# Patient Record
Sex: Female | Born: 1990 | Race: White | Hispanic: No | Smoking: Former smoker
Health system: Southern US, Community
[De-identification: ages and names within clinical notes are randomized; demographics above are authoritative.]

## PROBLEM LIST (undated history)

## (undated) ENCOUNTER — Inpatient Hospital Stay (HOSPITAL_COMMUNITY): Payer: Self-pay

## (undated) ENCOUNTER — Emergency Department (HOSPITAL_COMMUNITY): Admission: EM | Payer: BC Managed Care – PPO | Source: Home / Self Care

## (undated) DIAGNOSIS — O009 Unspecified ectopic pregnancy without intrauterine pregnancy: Secondary | ICD-10-CM

## (undated) DIAGNOSIS — F329 Major depressive disorder, single episode, unspecified: Secondary | ICD-10-CM

## (undated) DIAGNOSIS — F419 Anxiety disorder, unspecified: Secondary | ICD-10-CM

## (undated) DIAGNOSIS — J45909 Unspecified asthma, uncomplicated: Secondary | ICD-10-CM

## (undated) DIAGNOSIS — F32A Depression, unspecified: Secondary | ICD-10-CM

## (undated) DIAGNOSIS — J439 Emphysema, unspecified: Secondary | ICD-10-CM

## (undated) DIAGNOSIS — F319 Bipolar disorder, unspecified: Secondary | ICD-10-CM

## (undated) DIAGNOSIS — I10 Essential (primary) hypertension: Secondary | ICD-10-CM

## (undated) DIAGNOSIS — F99 Mental disorder, not otherwise specified: Secondary | ICD-10-CM

## (undated) HISTORY — PX: DILATION AND CURETTAGE OF UTERUS: SHX78

## (undated) HISTORY — DX: Essential (primary) hypertension: I10

## (undated) HISTORY — PX: LAPAROSCOPY FOR ECTOPIC PREGNANCY: SUR765

## (undated) HISTORY — PX: ADENOIDECTOMY AND MYRINGOTOMY WITH TUBE PLACEMENT: SHX5714

---
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
## 2011-07-19 LAB — CBC
HCT: 25.3 — ABNORMAL LOW
HCT: 27.9 — ABNORMAL LOW
HCT: 30.8 — ABNORMAL LOW
Hemoglobin: 10 — ABNORMAL LOW
Hemoglobin: 10.5 — ABNORMAL LOW
Hemoglobin: 8.7 — ABNORMAL LOW
Hemoglobin: 9.6 — ABNORMAL LOW
MCHC: 34
MCHC: 34.3
MCHC: 34.3
MCV: 87.5
MCV: 87.6
Platelets: 198
Platelets: 210
Platelets: 220
RBC: 2.88 — ABNORMAL LOW
RBC: 3.19 — ABNORMAL LOW
RBC: 3.34 — ABNORMAL LOW
RBC: 3.51 — ABNORMAL LOW
RDW: 14.1
RDW: 14.4
WBC: 10.7

## 2011-07-19 LAB — COMPREHENSIVE METABOLIC PANEL
AST: 18
Alkaline Phosphatase: 175 — ABNORMAL HIGH
Alkaline Phosphatase: 185 — ABNORMAL HIGH
Calcium: 8.8
Calcium: 9.1
Chloride: 107
Potassium: 3.9
Potassium: 3.9
Sodium: 135
Sodium: 136
Total Bilirubin: 0.5
Total Bilirubin: 0.5
Total Protein: 6.1

## 2011-07-19 LAB — GLUCOSE, CAPILLARY: Glucose-Capillary: 79

## 2011-07-19 LAB — URINALYSIS, ROUTINE W REFLEX MICROSCOPIC
Bilirubin Urine: NEGATIVE
Glucose, UA: NEGATIVE
Hgb urine dipstick: NEGATIVE
Ketones, ur: NEGATIVE
Nitrite: NEGATIVE
Protein, ur: NEGATIVE
Specific Gravity, Urine: 1.015
Urobilinogen, UA: 0.2
pH: 8

## 2011-07-19 LAB — POCT URINALYSIS DIP (DEVICE)
Hgb urine dipstick: NEGATIVE
Ketones, ur: NEGATIVE
Urobilinogen, UA: 0.2
pH: 7.5

## 2011-07-19 LAB — URIC ACID: Uric Acid, Serum: 5.7

## 2011-07-19 LAB — STREP B DNA PROBE

## 2013-05-30 ENCOUNTER — Encounter (HOSPITAL_COMMUNITY): Payer: Self-pay | Admitting: *Deleted

## 2013-05-30 ENCOUNTER — Emergency Department (HOSPITAL_COMMUNITY)
Admission: EM | Admit: 2013-05-30 | Discharge: 2013-05-31 | Disposition: A | Discharge status: Home / Self Care | Payer: Medicaid Other | Attending: Emergency Medicine | Admitting: Emergency Medicine

## 2013-05-30 DIAGNOSIS — R059 Cough, unspecified: Secondary | ICD-10-CM | POA: Insufficient documentation

## 2013-05-30 DIAGNOSIS — R0789 Other chest pain: Secondary | ICD-10-CM

## 2013-05-30 DIAGNOSIS — Z760 Encounter for issue of repeat prescription: Secondary | ICD-10-CM | POA: Insufficient documentation

## 2013-05-30 DIAGNOSIS — Z79899 Other long term (current) drug therapy: Secondary | ICD-10-CM | POA: Insufficient documentation

## 2013-05-30 DIAGNOSIS — R079 Chest pain, unspecified: Secondary | ICD-10-CM | POA: Insufficient documentation

## 2013-05-30 DIAGNOSIS — F17200 Nicotine dependence, unspecified, uncomplicated: Secondary | ICD-10-CM | POA: Insufficient documentation

## 2013-05-30 DIAGNOSIS — R05 Cough: Secondary | ICD-10-CM | POA: Insufficient documentation

## 2013-05-30 HISTORY — DX: Unspecified ectopic pregnancy without intrauterine pregnancy: O00.90

## 2013-05-30 NOTE — ED Notes (Signed)
Pt states she was seen at [facility] and was diagnosed with pneumonia. Pt given ibuprofen and toradol and that they are not helping with her pain. Pt states that she gets pneumonia twice a year. Pt went back to [place] and was get more rx medications Ultram and prilosec and she did not get the ultram rx filled because she has tried that before and that this does not work for her pain. Pt states that she wants something to help her sleep and pain control.

## 2013-05-31 MED ORDER — HYDROCODONE-ACETAMINOPHEN 5-325 MG PO TABS
1.0000 | ORAL_TABLET | Freq: Once | ORAL | Status: AC
  Administered 2013-05-31: 1 via ORAL
  Filled 2013-05-31: qty 1

## 2013-05-31 MED ORDER — HYDROCODONE-ACETAMINOPHEN 5-325 MG PO TABS: 1.0000 | ORAL_TABLET | Freq: Four times a day (QID) | ORAL | Status: DC | PRN

## 2013-05-31 NOTE — ED Provider Notes (Signed)
CSN: [id number]     Arrival date & time 05/30/13  2242 History     First MD Initiated Contact with Patient 05/30/13 2349     Chief Complaint  Patient presents with  . Muscle Pain  . Medication Refill   (Consider location/radiation/quality/duration/timing/severity/associated sxs/prior Treatment) HPI Comments: Patient was diagnosed with pneumonia at [facility] on Friday.  She was given azithromycin, and Toradol.  She's been taking her azithromycin she returned today complaining of shortness of breath.  She was rex-rayed showed no pneumothorax or pneumonia is resolving.  She continues to take her antibiotic and use her inhaler, but still having left posterior rib/chest discomfort.  It is keeping her from taking a deep breath, or sleeping.  She, states [facility], but notoriously has taken this in the past and it.  Has not helped.  She asked for something stronger and was refused.  She states she has an appointment with her primary care physician on Wednesday  Patient is a 22 y.o. female presenting with musculoskeletal pain. The history is provided by the patient.  Muscle Pain This is a new problem. The current episode started in the past 7 days. The problem occurs constantly. The problem has been gradually worsening. Associated symptoms include coughing. Pertinent negatives include no fever, headaches, nausea or weakness. The symptoms are aggravated by coughing and exertion. She has tried NSAIDs for the symptoms. The treatment provided no relief.    Past Medical History  Diagnosis Date  . Ectopic pregnancy    History reviewed. No pertinent past surgical history. History reviewed. No pertinent family history. History  Substance Use Topics  . Smoking status: Current Every Day Smoker  . Smokeless tobacco: Not on file  . Alcohol Use: Yes   OB History   Grav Para Term Preterm Abortions TAB SAB Ect Mult Living                 Review of Systems   Constitutional: Negative for fever.  Respiratory: Positive for cough. Negative for wheezing.   Gastrointestinal: Negative for nausea.  Neurological: Negative for dizziness, weakness and headaches.  All other systems reviewed and are negative.    Allergies  Review of patient's allergies indicates no known allergies.  Home Medications   Current Outpatient Rx  Name  Route  Sig  Dispense  Refill  . clonazePAM (KLONOPIN) 1 MG tablet   Oral   Take 1 mg by mouth 4 (four) times daily as needed for anxiety.         . hydrOXYzine (VISTARIL) 25 MG capsule   Oral   Take 25 mg by mouth daily.         . LamoTRIgine (LAMICTAL PO)   Oral   Take 1 tablet by mouth 2 (two) times daily.         . sertraline (ZOLOFT) 50 MG tablet   Oral   Take 50 mg by mouth 2 (two) times daily.         Marland Kitchen HYDROcodone-acetaminophen (NORCO/VICODIN) 5-325 MG per tablet   Oral   Take 1 tablet by mouth every 6 (six) hours as needed for pain.   14 tablet   0    BP 123/66  Pulse 73  Temp(Src) 98.1 F (36.7 C) (Oral)  Resp 20  SpO2 100% Physical Exam  Nursing note and vitals reviewed. Constitutional: She appears well-developed and well-nourished.  HENT:  Head: Normocephalic.  Eyes: Pupils are equal, round, and reactive to light.  Neck: Normal range of motion.  Cardiovascular: Normal rate.   Pulmonary/Chest: Effort normal and breath sounds normal. She exhibits tenderness.  Musculoskeletal: Normal range of motion.  Neurological: She is alert.  Skin: Skin is warm and dry. No rash noted. No erythema.    ED Course   Procedures (including critical care time)  Labs Reviewed - No data to display No results found. 1. Left-sided chest wall pain     MDM   Will provide patient with prescription for Vicodin, and encouraged her to continue her antibiotic inhaler, and Toradol, and to make sure you keep an appointment with her primary care physician as scheduled on Wednesday  Arman Filter,  NP 05/31/13 0015

## 2013-05-31 NOTE — ED Provider Notes (Signed)
Medical screening examination/treatment/procedure(s) were performed by non-physician practitioner and as supervising physician I was immediately available for consultation/collaboration.   Hanley Seamen, MD 05/31/13 [id number]

## 2014-07-08 ENCOUNTER — Inpatient Hospital Stay (HOSPITAL_COMMUNITY)
Admission: AD | Admit: 2014-07-08 | Discharge: 2014-07-08 | Disposition: A | Discharge status: Home / Self Care | Payer: Medicaid Other | Source: Ambulatory Visit | Attending: Obstetrics and Gynecology | Admitting: Obstetrics and Gynecology

## 2014-07-08 ENCOUNTER — Encounter (HOSPITAL_COMMUNITY): Payer: Self-pay

## 2014-07-08 DIAGNOSIS — R109 Unspecified abdominal pain: Secondary | ICD-10-CM | POA: Diagnosis present

## 2014-07-08 DIAGNOSIS — B373 Candidiasis of vulva and vagina: Secondary | ICD-10-CM | POA: Diagnosis not present

## 2014-07-08 DIAGNOSIS — B3731 Acute candidiasis of vulva and vagina: Secondary | ICD-10-CM | POA: Insufficient documentation

## 2014-07-08 DIAGNOSIS — O2390 Unspecified genitourinary tract infection in pregnancy, unspecified trimester: Secondary | ICD-10-CM | POA: Insufficient documentation

## 2014-07-08 DIAGNOSIS — K219 Gastro-esophageal reflux disease without esophagitis: Secondary | ICD-10-CM

## 2014-07-08 DIAGNOSIS — O0932 Supervision of pregnancy with insufficient antenatal care, second trimester: Secondary | ICD-10-CM

## 2014-07-08 DIAGNOSIS — O0930 Supervision of pregnancy with insufficient antenatal care, unspecified trimester: Secondary | ICD-10-CM | POA: Diagnosis not present

## 2014-07-08 DIAGNOSIS — B86 Scabies: Secondary | ICD-10-CM | POA: Insufficient documentation

## 2014-07-08 DIAGNOSIS — K209 Esophagitis, unspecified without bleeding: Secondary | ICD-10-CM | POA: Insufficient documentation

## 2014-07-08 HISTORY — DX: Major depressive disorder, single episode, unspecified: F32.9

## 2014-07-08 HISTORY — DX: Mental disorder, not otherwise specified: F99

## 2014-07-08 HISTORY — DX: Depression, unspecified: F32.A

## 2014-07-08 HISTORY — DX: Anxiety disorder, unspecified: F41.9

## 2014-07-08 HISTORY — DX: Bipolar disorder, unspecified: F31.9

## 2014-07-08 HISTORY — DX: Unspecified asthma, uncomplicated: J45.909

## 2014-07-08 HISTORY — DX: Emphysema, unspecified: J43.9

## 2014-07-08 LAB — CBC
HEMATOCRIT: 29.1 % — AB (ref 36.0–46.0)
HEMOGLOBIN: 9.9 g/dL — AB (ref 12.0–15.0)
MCH: 29.4 pg (ref 26.0–34.0)
MCHC: 34 g/dL (ref 30.0–36.0)
MCV: 86.4 fL (ref 78.0–100.0)
Platelets: 204 10*3/uL (ref 150–400)
RBC: 3.37 MIL/uL — AB (ref 3.87–5.11)
RDW: 14.9 % (ref 11.5–15.5)
WBC: 8.7 10*3/uL (ref 4.0–10.5)

## 2014-07-08 LAB — URINALYSIS, ROUTINE W REFLEX MICROSCOPIC
BILIRUBIN URINE: NEGATIVE
GLUCOSE, UA: NEGATIVE mg/dL
HGB URINE DIPSTICK: NEGATIVE
KETONES UR: NEGATIVE mg/dL
Nitrite: NEGATIVE
PH: 6 (ref 5.0–8.0)
Protein, ur: NEGATIVE mg/dL
SPECIFIC GRAVITY, URINE: 1.02 (ref 1.005–1.030)
UROBILINOGEN UA: 0.2 mg/dL (ref 0.0–1.0)

## 2014-07-08 LAB — WET PREP, GENITAL
CLUE CELLS WET PREP: NONE SEEN
TRICH WET PREP: NONE SEEN
Yeast Wet Prep HPF POC: NONE SEEN

## 2014-07-08 LAB — URINE MICROSCOPIC-ADD ON

## 2014-07-08 LAB — ABO/RH: ABO/RH(D): A POS

## 2014-07-08 LAB — AMNISURE RUPTURE OF MEMBRANE (ROM) NOT AT ARMC: AMNISURE: NEGATIVE

## 2014-07-08 MED ORDER — RANITIDINE HCL 150 MG PO TABS: 150.0000 mg | ORAL_TABLET | Freq: Two times a day (BID) | ORAL | Status: DC

## 2014-07-08 MED ORDER — PERMETHRIN 5 % EX CREA: 1.0000 "application " | TOPICAL_CREAM | Freq: Once | CUTANEOUS | Status: DC

## 2014-07-08 MED ORDER — FLUCONAZOLE 150 MG PO TABS
150.0000 mg | ORAL_TABLET | Freq: Once | ORAL | Status: AC
  Administered 2014-07-08: 150 mg via ORAL
  Filled 2014-07-08: qty 1

## 2014-07-08 MED ORDER — FLUCONAZOLE 150 MG PO TABS: 150.0000 mg | ORAL_TABLET | Freq: Every day | ORAL | Status: DC

## 2014-07-08 NOTE — MAU Note (Signed)
Patient states she has had no prenatal care except at [facility]. States she has a rash on both arms that is itching. States she has epigastric pain after eating. States she has a watery aginal discharge. Denies bleeding.

## 2014-07-08 NOTE — MAU Provider Note (Signed)
History     CSN: [id number]  Arrival date and time: 07/08/14 1700   First Provider Initiated Contact with Patient 07/08/14 2035       Chief Complaint  Patient presents with  . Shortness of Breath  . Abdominal Pain  . Rash  . Vaginal Discharge   Shortness of Breath Associated symptoms include abdominal pain and a rash.  Abdominal Pain  Rash Pertinent negatives include no shortness of breath (None currently).  Vaginal Discharge The patient's primary symptoms include a vaginal discharge. Associated symptoms include abdominal pain and rash.   Alexandra Moreno is a 23 y.o. female who presents with upper abdominal pain, a rash on her arms and vaginal discharge. She was tested 2 weeks for [facility] and wet prep and was treated for BV. She has not started prenatal care because she does not have a pregnancy verification letter. The patient is concerned that her water is broke. She has been leaking clear fluid and has a history of preterm labor. She had a preterm delivery at [redacted]w[redacted]d back in March due to ROM. Her abdominal pain is upper; hurts when she lays down and after she eats. She has not taken anything for heart burn. The rash is terribly itchy "ithces all the time". She feels like she is scratching constantly. She has tried benadryl cream, calamine lotion and nothing has helped. No body else in the house has this rash.      OB History   Grav Para Term Preterm Abortions TAB SAB Ect Mult Living   Past Medical History  Diagnosis Date  . Ectopic pregnancy   . Complication of anesthesia   . Asthma   . Emphysema lung     Sees a pulmologist currently  . Mental disorder   . Depression   . Anxiety   . Manic-depressive     Past Surgical History  Procedure Laterality Date  . Laparoscopy for ectopic pregnancy    . Dilation and curettage of uterus    . Adenoidectomy and myringotomy with tube placement      Family History  Problem Relation Age of Onset  .  Hypertension Father   . Heart disease Father     History  Substance Use Topics  . Smoking status: Current Every Day Smoker -- 0.50 packs/day    Types: Cigarettes  . Smokeless tobacco: Never Used  . Alcohol Use: Yes     Comment: social before pregnancy    Allergies:  Allergies  Allergen Reactions  . Tylenol [Acetaminophen] Other (See Comments)    "Jittery, lethargic"    Prescriptions prior to admission  Medication Sig Dispense Refill  . Omega-3 Fatty Acids (FISH OIL) 1200 MG CAPS Take 1 capsule by mouth daily.      . Prenatal Vit-Fe Fumarate-FA (PRENATAL MULTIVITAMIN) TABS tablet Take 1 tablet by mouth daily at 12 noon.       Results for orders placed during the hospital encounter of 07/08/14 (from the past 48 hour(s))  URINALYSIS, ROUTINE W REFLEX MICROSCOPIC     Status: Abnormal   Collection Time    07/08/14  7:55 PM      Result Value Ref Range   Color, Urine YELLOW  YELLOW   APPearance CLEAR  CLEAR   Specific Gravity, Urine 1.020  1.005 - 1.030   pH 6.0  5.0 - 8.0   Glucose, UA NEGATIVE  NEGATIVE mg/dL   Hgb urine  dipstick NEGATIVE  NEGATIVE   Bilirubin Urine NEGATIVE  NEGATIVE   Ketones, ur NEGATIVE  NEGATIVE mg/dL   Protein, ur NEGATIVE  NEGATIVE mg/dL   Urobilinogen, UA 0.2  0.0 - 1.0 mg/dL   Nitrite NEGATIVE  NEGATIVE   Leukocytes, UA SMALL (*) NEGATIVE  URINE MICROSCOPIC-ADD ON     Status: Abnormal   Collection Time    07/08/14  7:55 PM      Result Value Ref Range   Squamous Epithelial / LPF RARE  RARE   WBC, UA 3-6  <3 WBC/hpf   RBC / HPF 0-2  <3 RBC/hpf   Bacteria, UA FEW (*) RARE  WET PREP, GENITAL     Status: Abnormal   Collection Time    07/08/14  9:00 PM      Result Value Ref Range   Yeast Wet Prep HPF POC NONE SEEN  NONE SEEN   Trich, Wet Prep NONE SEEN  NONE SEEN   Clue Cells Wet Prep HPF POC NONE SEEN  NONE SEEN   WBC, Wet Prep HPF POC MODERATE (*) NONE SEEN   Comment: MANY BACTERIA SEEN    Review of Systems  Respiratory: Negative for  shortness of breath (None currently).   Gastrointestinal: Positive for heartburn and abdominal pain.  Genitourinary: Positive for vaginal discharge.  Skin: Positive for rash.   Physical Exam   Blood pressure 121/69, pulse 97, temperature 98.2 F (36.8 C), temperature source Oral, resp. rate 18, height  (1.651 m), weight 79.652 kg (175 lb 9.6 oz), last menstrual period 03/08/2014, SpO2 100.00%.  Physical Exam  Constitutional: She is oriented to person, place, and time. She appears well-developed and well-nourished. No distress.  HENT:  Head: Normocephalic.  Eyes: Pupils are equal, round, and reactive to light.  Neck: Neck supple.  Respiratory: Effort normal and breath sounds normal.  GI: Soft. She exhibits no distension. There is no tenderness. There is no rebound and no guarding.  Genitourinary:  Speculum exam: Vagina - Small amount of thick, white vaginal discharge on vaginal wall, no odor. Scant amount of thin, white discharge in canal; no pooling of fluid  Cervix - No contact bleeding Bimanual exam: Cervix closed, no CMT  Uterus non tender, enlarged  Adnexa non tender, no masses bilaterally GC/Chlam, wet prep done Chaperone present for exam.   Musculoskeletal: Normal range of motion.  Neurological: She is alert and oriented to person, place, and time.  Skin: Skin is warm and dry. Lesion and rash noted. She is not diaphoretic.  Red, blister like papular rash on bilateral wrists, forearms and elbows. Burrows noted     MAU Course  Procedures None  MDM + fht  Amnisure negative Wet pre GC  Assessment and Plan   A:  1. Yeast vaginitis   2. Gastroesophageal reflux disease, esophagitis presence not specified   3. Scabies   4. No prenatal care in current pregnancy, second trimester    P:  Discharge home in stable condition RX: Zantac, permethrin  Start prenatal care ASAP Return to MAU if symptoms worsen Scabies precautions discussed; must wash everything in  hot water, directions on how to use permethrin lotion discussed  Pregnancy verification letter given Out patient detailed [place] ordered; [place] will call patient to schedule.   Alexandra Hansen Alexandra Beske, NP  07/11/2014, 8:56 AM

## 2014-07-09 LAB — GC/CHLAMYDIA PROBE AMP
CT Probe RNA: NEGATIVE
GC PROBE AMP APTIMA: NEGATIVE

## 2014-07-09 LAB — HIV ANTIBODY (ROUTINE TESTING W REFLEX): HIV 1&2 Ab, 4th Generation: NONREACTIVE

## 2014-07-11 NOTE — MAU Provider Note (Signed)
Attestation of Attending Supervision of Advanced Practitioner (CNM/NP): Evaluation and management procedures were performed by the Advanced Practitioner under my supervision and collaboration.  I have reviewed the Advanced Practitioner's note and chart, and I agree with the management and plan.  Marquee Fuchs 07/11/2014 9:12 PM

## 2014-07-26 ENCOUNTER — Ambulatory Visit (HOSPITAL_COMMUNITY)
Admission: RE | Admit: 2014-07-26 | Discharge: 2014-07-26 | Disposition: A | Discharge status: Home / Self Care | Payer: Medicaid Other | Source: Ambulatory Visit | Attending: Obstetrics and Gynecology | Admitting: Obstetrics and Gynecology

## 2014-07-26 ENCOUNTER — Other Ambulatory Visit (HOSPITAL_COMMUNITY): Payer: Self-pay | Admitting: Obstetrics and Gynecology

## 2014-07-26 DIAGNOSIS — Z3A20 20 weeks gestation of pregnancy: Secondary | ICD-10-CM

## 2014-07-26 DIAGNOSIS — Z3689 Encounter for other specified antenatal screening: Secondary | ICD-10-CM

## 2014-07-26 DIAGNOSIS — O0932 Supervision of pregnancy with insufficient antenatal care, second trimester: Secondary | ICD-10-CM

## 2014-07-26 DIAGNOSIS — Z3A00 Weeks of gestation of pregnancy not specified: Secondary | ICD-10-CM | POA: Insufficient documentation

## 2014-07-26 DIAGNOSIS — Z36 Encounter for antenatal screening of mother: Secondary | ICD-10-CM | POA: Insufficient documentation

## 2014-07-26 DIAGNOSIS — O09292 Supervision of pregnancy with other poor reproductive or obstetric history, second trimester: Secondary | ICD-10-CM | POA: Diagnosis present

## 2014-08-22 ENCOUNTER — Encounter (HOSPITAL_COMMUNITY): Payer: Self-pay

## 2014-09-12 ENCOUNTER — Encounter (HOSPITAL_COMMUNITY): Payer: Self-pay | Admitting: *Deleted

## 2014-09-12 ENCOUNTER — Inpatient Hospital Stay (HOSPITAL_COMMUNITY)
Admission: AD | Admit: 2014-09-12 | Discharge: 2014-09-13 | Disposition: A | Discharge status: Home / Self Care | Payer: Medicaid Other | Source: Ambulatory Visit | Attending: Family Medicine | Admitting: Family Medicine

## 2014-09-12 DIAGNOSIS — O26892 Other specified pregnancy related conditions, second trimester: Secondary | ICD-10-CM | POA: Insufficient documentation

## 2014-09-12 DIAGNOSIS — R102 Pelvic and perineal pain: Secondary | ICD-10-CM | POA: Insufficient documentation

## 2014-09-12 DIAGNOSIS — O368120 Decreased fetal movements, second trimester, not applicable or unspecified: Secondary | ICD-10-CM | POA: Diagnosis not present

## 2014-09-12 DIAGNOSIS — Z3A27 27 weeks gestation of pregnancy: Secondary | ICD-10-CM | POA: Diagnosis not present

## 2014-09-12 DIAGNOSIS — O99332 Smoking (tobacco) complicating pregnancy, second trimester: Secondary | ICD-10-CM | POA: Diagnosis not present

## 2014-09-12 DIAGNOSIS — N898 Other specified noninflammatory disorders of vagina: Secondary | ICD-10-CM | POA: Insufficient documentation

## 2014-09-12 DIAGNOSIS — Z3490 Encounter for supervision of normal pregnancy, unspecified, unspecified trimester: Secondary | ICD-10-CM

## 2014-09-12 DIAGNOSIS — M25552 Pain in left hip: Secondary | ICD-10-CM

## 2014-09-12 LAB — FETAL FIBRONECTIN: Fetal Fibronectin: NEGATIVE

## 2014-09-12 LAB — OB RESULTS CONSOLE GC/CHLAMYDIA
CHLAMYDIA, DNA PROBE: NEGATIVE
GC PROBE AMP, GENITAL: NEGATIVE

## 2014-09-12 LAB — WET PREP, GENITAL
CLUE CELLS WET PREP: NONE SEEN
Trich, Wet Prep: NONE SEEN
Yeast Wet Prep HPF POC: NONE SEEN

## 2014-09-12 NOTE — MAU Note (Signed)
PT SAYS SHE STARTED FEELING  LEAKING  FLUID TODAY-  NONE  NOW.   GETS PNC-     NONE-     SHE HAS [place] ACCESS.   SHE WAS HERE  IN OCT.             SAYS CRAMPING  STARTED   ON SAT-  WORSE   TODAY.   LAST SEX-    OCT.      DENIES HSV  .  HAD MRSA-  2014-      IN RIGHT ARMPIT.

## 2014-09-12 NOTE — MAU Provider Note (Signed)
History     CSN: [id number]  Arrival date and time: 09/12/14 2047   First Provider Initiated Contact with Patient 09/12/14 2218      No chief complaint on file.  HPI  Alexandra Moreno is a 23 y.o. [id number] at 1053w0d who presents with three complaints.  Pelvic pain Intense pressure in pelvic inguinal area. No radiation. Nothing has made the pressure better. Position aggravates at times. She has braxston hicks several days ago that have intensified.  Decreased fetal movement Decreased fetal movement yesterday but has not been moving today. Usually baby moves multiple times per day. Says she has been keeping well hydrated with about 1 gallon of water per day.  Leaking of fluid Patient states she has had some clear leaking fluid that she noticed after sitting down, which happened a few times today. No fluid down her legs. No vaginal discharge, no vaginal bleeding.  OB History    Gravida Para Term Preterm AB TAB SAB Ectopic Multiple Living   5 3 2 1 1   1  2       Past Medical History  Diagnosis Date  . Ectopic pregnancy   . Complication of anesthesia   . Asthma   . Emphysema lung     Sees a pulmologist currently  . Mental disorder   . Depression   . Anxiety   . Manic-depressive     Past Surgical History  Procedure Laterality Date  . Laparoscopy for ectopic pregnancy    . Dilation and curettage of uterus    . Adenoidectomy and myringotomy with tube placement      Family History  Problem Relation Age of Onset  . Hypertension Father   . Heart disease Father     History  Substance Use Topics  . Smoking status: Current Every Day Smoker -- 0.50 packs/day    Types: Cigarettes  . Smokeless tobacco: Never Used  . Alcohol Use: Yes     Comment: social before pregnancy    Allergies:  Allergies  Allergen Reactions  . Tylenol [Acetaminophen] Other (See Comments)    "Jittery, lethargic"    Prescriptions prior to admission  Medication Sig Dispense Refill Last Dose   . Omega-3 Fatty Acids (FISH OIL) 1200 MG CAPS Take 1 capsule by mouth daily.   09/12/2014 at Unknown time  . Prenatal Vit-Fe Fumarate-FA (PRENATAL MULTIVITAMIN) TABS tablet Take 1 tablet by mouth daily at 12 noon.   09/12/2014 at Unknown time  . ranitidine (ZANTAC) 150 MG tablet Take 1 tablet (150 mg total) by mouth 2 (two) times daily. (Patient taking differently: Take 150 mg by mouth 2 (two) times daily as needed for heartburn. ) 60 tablet 0 Past Week at Unknown time  . fluconazole (DIFLUCAN) 150 MG tablet Take 1 tablet (150 mg total) by mouth daily. (Patient not taking: Reported on 09/12/2014) 1 tablet 0 Completed Course at Unknown time  . permethrin (ELIMITE) 5 % cream Apply 1 application topically once. (Patient not taking: Reported on 09/12/2014) 60 g 1 Not Taking at Unknown time    Review of Systems  Constitutional: Negative for fever.  Respiratory: Positive for shortness of breath (mild).   Cardiovascular: Negative for chest pain.  Gastrointestinal: Positive for abdominal pain. Negative for nausea and vomiting.  Neurological: Negative for headaches.   Physical Exam   Blood pressure 121/60, pulse 107, temperature 98.2 F (36.8 C), temperature source Oral, resp. rate 20, height 5\' 5"  (1.651 m), weight 89.415 kg (197 lb 2 oz), last menstrual  period 03/08/2014.  Physical Exam  Constitutional: She is oriented to person, place, and time. She appears well-developed and well-nourished.  Cardiovascular: Normal rate, regular rhythm and normal heart sounds.   Respiratory: Effort normal and breath sounds normal. No respiratory distress. She has no wheezes.  GI: Soft. There is no tenderness. There is no rebound.  Genitourinary: Cervix exhibits no motion tenderness. No tenderness in the vagina. No foreign body around the vagina. Vaginal discharge (white thick discharge) found.  Musculoskeletal: Normal range of motion. She exhibits no edema.  Neurological: She is alert and oriented to person,  place, and time.  SVE: 1 (ext) closed (int) / thick/ high  FHT: 140bpm, moderate variability, +accels, +variable decels Contractions: None Reassuring NST, reactive  MAU Course  Procedures  MDM  Labs Reviewed  WET PREP, GENITAL - Abnormal; Notable for the following:    WBC, Wet Prep HPF POC FEW (*)    All other components within normal limits  GC/CHLAMYDIA PROBE AMP  FETAL FIBRONECTIN   Fetal fibronectin: negative  GC/Gonorrhea: pending   Assessment and Plan  Alexandra Moreno is a 23 y.o. [id number] at 4284w0d with vaginal discharge and round ligament pain  #Vaginal discharge: not consistent with amniotic fluid leak. No pooling or ferning on SSE - wet prep negative - follow-up gc/chlamydia - reassurance that patient is not ruptured  #Round ligament pain: - Tylenol for pain - Not in active labor per SVE and with reasurring neg FFN  #Decreased fetal movement: Reassuring NST. - Reassurance given to patient - [facility]  Jacquelin Hawkingettey, Ralph 09/12/2014, 10:25 PM   I saw and evaluated the patient. I agree with the findings and the plan of care as documented in the resident's note. Elita Boone.Analuisa Tudor C, MD

## 2014-09-13 DIAGNOSIS — M25552 Pain in left hip: Secondary | ICD-10-CM

## 2014-09-13 LAB — GC/CHLAMYDIA PROBE AMP
CT Probe RNA: NEGATIVE
GC PROBE AMP APTIMA: NEGATIVE

## 2014-09-21 ENCOUNTER — Ambulatory Visit (HOSPITAL_COMMUNITY)
Admission: RE | Admit: 2014-09-21 | Discharge: 2014-09-21 | Disposition: A | Discharge status: Home / Self Care | Payer: Medicaid Other | Source: Ambulatory Visit | Attending: Family Medicine | Admitting: Family Medicine

## 2014-09-21 DIAGNOSIS — O09293 Supervision of pregnancy with other poor reproductive or obstetric history, third trimester: Secondary | ICD-10-CM | POA: Diagnosis present

## 2014-09-21 DIAGNOSIS — Z3490 Encounter for supervision of normal pregnancy, unspecified, unspecified trimester: Secondary | ICD-10-CM

## 2014-09-21 DIAGNOSIS — Z3A28 28 weeks gestation of pregnancy: Secondary | ICD-10-CM | POA: Insufficient documentation

## 2014-09-21 DIAGNOSIS — Z0489 Encounter for examination and observation for other specified reasons: Secondary | ICD-10-CM | POA: Insufficient documentation

## 2014-09-21 DIAGNOSIS — Z36 Encounter for antenatal screening of mother: Secondary | ICD-10-CM | POA: Diagnosis not present

## 2014-09-21 DIAGNOSIS — IMO0002 Reserved for concepts with insufficient information to code with codable children: Secondary | ICD-10-CM | POA: Insufficient documentation

## 2014-10-21 NOTE — L&D Delivery Note (Signed)
Patient is 24 y.o. [id number] 8935w4d admitted for IOL 2/2 prolonged pregnancy, late to care   Delivery Note At 5:20 AM a viable female was delivered via Vaginal, Spontaneous Delivery (Presentation: Middle Occiput Anterior).  APGAR: 9, 9; weight  pending Placenta status: Intact, Spontaneous.  Cord:  with the following complications: None.    Anesthesia: Epidural  Episiotomy: None Lacerations: None Suture Repair: n/a Est. Blood Loss (mL): 400 total.  300mL clots delivered immediately after baby delivered, 100mL with delivery alone.  Suspect ?small abruption given rapid cervical change.  Mom to postpartum.  Baby to Couplet care / Skin to Skin.  Nya Monds ROCIO 12/17/2014, 6:08 AM

## 2014-11-17 ENCOUNTER — Inpatient Hospital Stay (HOSPITAL_COMMUNITY)
Admission: AD | Admit: 2014-11-17 | Discharge: 2014-11-17 | Disposition: A | Discharge status: Home / Self Care | Payer: Medicaid Other | Source: Ambulatory Visit | Attending: Obstetrics & Gynecology | Admitting: Obstetrics & Gynecology

## 2014-11-17 ENCOUNTER — Encounter (HOSPITAL_COMMUNITY): Payer: Self-pay | Admitting: *Deleted

## 2014-11-17 DIAGNOSIS — R109 Unspecified abdominal pain: Secondary | ICD-10-CM | POA: Diagnosis not present

## 2014-11-17 DIAGNOSIS — Z3A36 36 weeks gestation of pregnancy: Secondary | ICD-10-CM | POA: Insufficient documentation

## 2014-11-17 DIAGNOSIS — O9989 Other specified diseases and conditions complicating pregnancy, childbirth and the puerperium: Secondary | ICD-10-CM | POA: Insufficient documentation

## 2014-11-17 LAB — OB RESULTS CONSOLE GBS: GBS: NEGATIVE

## 2014-11-17 NOTE — MAU Note (Signed)
PT SAYS  UC HURT BAD  SINCE  2PM.  - FELT  PRESSURE     STARTED ON SUN.    PNC-  NONE-  SO SHE COMES  HERE . VE  - IN DEC-  CLOSED.     HAS TROUBLE  WITH GETTING  MEDICAID  STRAIGHT.    DENIES   HSV  AND MRSA.   GBS- UNSURE.

## 2014-11-17 NOTE — Discharge Instructions (Signed)
Braxton Hicks Contractions °Contractions of the uterus can occur throughout pregnancy. Contractions are not always a sign that you are in labor.  °WHAT ARE BRAXTON HICKS CONTRACTIONS?  °Contractions that occur before labor are called Braxton Hicks contractions, or false labor. Toward the end of pregnancy (32-34 weeks), these contractions can develop more often and may become more forceful. This is not true labor because these contractions do not result in opening (dilatation) and thinning of the cervix. They are sometimes difficult to tell apart from true labor because these contractions can be forceful and people have different pain tolerances. You should not feel embarrassed if you go to the hospital with false labor. Sometimes, the only way to tell if you are in true labor is for your health care provider to look for changes in the cervix. °If there are no prenatal problems or other health problems associated with the pregnancy, it is completely safe to be sent home with false labor and await the onset of true labor. °HOW CAN YOU TELL THE DIFFERENCE BETWEEN TRUE AND FALSE LABOR? °False Labor °· The contractions of false labor are usually shorter and not as hard as those of true labor.   °· The contractions are usually irregular.   °· The contractions are often felt in the front of the lower abdomen and in the groin.   °· The contractions may go away when you walk around or change positions while lying down.   °· The contractions get weaker and are shorter lasting as time goes on.   °· The contractions do not usually become progressively stronger, regular, and closer together as with true labor.   °True Labor °· Contractions in true labor last 30-70 seconds, become very regular, usually become more intense, and increase in frequency.   °· The contractions do not go away with walking.   °· The discomfort is usually felt in the top of the uterus and spreads to the lower abdomen and low back.   °· True labor can be  determined by your health care provider with an exam. This will show that the cervix is dilating and getting thinner.   °WHAT TO REMEMBER °· Keep up with your usual exercises and follow other instructions given by your health care provider.   °· Take medicines as directed by your health care provider.   °· Keep your regular prenatal appointments.   °· Eat and drink lightly if you think you are going into labor.   °· If Braxton Hicks contractions are making you uncomfortable:   °¨ Change your position from lying down or resting to walking, or from walking to resting.   °¨ Sit and rest in a tub of warm water.   °¨ Drink 2-3 glasses of water. Dehydration may cause these contractions.   °¨ Do slow and deep breathing several times an hour.   °WHEN SHOULD I SEEK IMMEDIATE MEDICAL CARE? °Seek immediate medical care if: °· Your contractions become stronger, more regular, and closer together.   °· You have fluid leaking or gushing from your vagina.   °· You have a fever.   °· You pass blood-tinged mucus.   °· You have vaginal bleeding.   °· You have continuous abdominal pain.   °· You have low back pain that you never had before.   °· You feel your baby's head pushing down and causing pelvic pressure.   °· Your baby is not moving as much as it used to.   °Document Released: 10/07/2005 Document Revised: 10/12/2013 Document Reviewed: 07/19/2013 °[employer]® Patient Information ©2015 [employer]. This information is not intended to replace advice given to you by your health care   provider. Make sure you discuss any questions you have with your health care provider. ° °

## 2014-11-18 LAB — HEPATITIS B SURFACE ANTIGEN: HEP B S AG: NEGATIVE

## 2014-11-19 LAB — CULTURE, BETA STREP (GROUP B ONLY)

## 2014-11-19 LAB — RPR: RPR Ser Ql: NONREACTIVE

## 2014-11-19 LAB — RUBELLA SCREEN: RUBELLA: 1.01 {index} (ref >0.99)

## 2014-11-19 LAB — VARICELLA ZOSTER ANTIBODY, IGG: VARICELLA IGG: 2172 {index} (ref >165)

## 2014-11-30 ENCOUNTER — Encounter: Payer: Self-pay | Admitting: Family

## 2014-11-30 ENCOUNTER — Other Ambulatory Visit (HOSPITAL_COMMUNITY)
Admission: RE | Admit: 2014-11-30 | Discharge: 2014-11-30 | Disposition: A | Discharge status: Home / Self Care | Payer: Medicaid Other | Source: Ambulatory Visit | Attending: Family | Admitting: Family

## 2014-11-30 ENCOUNTER — Ambulatory Visit (INDEPENDENT_AMBULATORY_CARE_PROVIDER_SITE_OTHER): Payer: Medicaid Other | Admitting: Family

## 2014-11-30 VITALS — BP 107/55 | HR 96 | Temp 98.1°F | Ht 65.5 in | Wt 214.5 lb

## 2014-11-30 DIAGNOSIS — O0933 Supervision of pregnancy with insufficient antenatal care, third trimester: Secondary | ICD-10-CM | POA: Insufficient documentation

## 2014-11-30 DIAGNOSIS — Z113 Encounter for screening for infections with a predominantly sexual mode of transmission: Secondary | ICD-10-CM | POA: Insufficient documentation

## 2014-11-30 DIAGNOSIS — Z3483 Encounter for supervision of other normal pregnancy, third trimester: Secondary | ICD-10-CM

## 2014-11-30 DIAGNOSIS — Z01419 Encounter for gynecological examination (general) (routine) without abnormal findings: Secondary | ICD-10-CM | POA: Diagnosis not present

## 2014-11-30 DIAGNOSIS — Z23 Encounter for immunization: Secondary | ICD-10-CM

## 2014-11-30 LAB — CBC
HEMATOCRIT: 33.4 % — AB (ref 36.0–46.0)
HEMOGLOBIN: 11.2 g/dL — AB (ref 12.0–15.0)
MCH: 28.9 pg (ref 26.0–34.0)
MCHC: 33.5 g/dL (ref 30.0–36.0)
MCV: 86.3 fL (ref 78.0–100.0)
MPV: 9.5 fL (ref 8.6–12.4)
Platelets: 269 10*3/uL (ref 150–400)
RBC: 3.87 MIL/uL (ref 3.87–5.11)
RDW: 15 % (ref 11.5–15.5)
WBC: 11.1 10*3/uL — ABNORMAL HIGH (ref 4.0–10.5)

## 2014-11-30 LAB — POCT URINALYSIS DIP (DEVICE)
BILIRUBIN URINE: NEGATIVE
GLUCOSE, UA: NEGATIVE mg/dL
HGB URINE DIPSTICK: NEGATIVE
KETONES UR: NEGATIVE mg/dL
Leukocytes, UA: NEGATIVE
Nitrite: NEGATIVE
Protein, ur: NEGATIVE mg/dL
SPECIFIC GRAVITY, URINE: 1.025 (ref 1.005–1.030)
UROBILINOGEN UA: 0.2 mg/dL (ref 0.0–1.0)
pH: 6.5 (ref 5.0–8.0)

## 2014-11-30 LAB — HIV ANTIBODY (ROUTINE TESTING W REFLEX): HIV 1&2 Ab, 4th Generation: NONREACTIVE

## 2014-11-30 LAB — GLUCOSE TOLERANCE, 1 HOUR (50G) W/O FASTING: Glucose, 1 Hour GTT: 134 mg/dL (ref 70–140)

## 2014-11-30 MED ORDER — TETANUS-DIPHTH-ACELL PERTUSSIS 5-2.5-18.5 LF-MCG/0.5 IM SUSP
0.5000 mL | Freq: Once | INTRAMUSCULAR | Status: AC
  Administered 2014-11-30: 0.5 mL via INTRAMUSCULAR

## 2014-11-30 NOTE — Progress Notes (Signed)
Subjective:    Tonny BollmanHayley C Millen is a [id number] 3933w1d being seen today for her first obstetrical visit.  Her obstetrical history is significant for history of 19 wk PPROM.  Marland Kitchen. Patient undecided regarding intent to breast feed. Pregnancy history fully reviewed.  Patient reports irregular contractions and pressure in hips.  Filed Vitals:   11/30/14 0812 11/30/14 0813  BP: 107/55   Pulse: 96   Temp: 98.1 F (36.7 C)   Height:  5' 5.5" (1.664 m)  Weight: 214 lb 8 oz (97.297 kg)     HISTORY: OB History  Gravida Para Term Preterm AB SAB TAB Ectopic Multiple Living  5 2 2  0 2   1  2     # Outcome Date GA Lbr Len/2nd Weight Sex Delivery Anes PTL Lv  5 Current           4 AB 01/07/14 4274w6d   F Vag-Spont EPI  N     Comments: delivered 19.6 weeks ; "my water broke" baby lived for 2 hours.  3 Term 09/22/12 9269w5d  7 lb 13 oz (3.544 kg) M Vag-Spont EPI  Y  2 Term 05/22/08 2716w1d  8 lb (3.629 kg) M Vag-Spont EPI  Y  1 Ectopic              Comments: "2014" removed right tube     Past Medical History  Diagnosis Date  . Ectopic pregnancy   . Asthma   . Mental disorder   . Depression   . Anxiety   . Manic-depressive   . Emphysema lung     Sees a pulmologist currently being tested for emphysema   Past Surgical History  Procedure Laterality Date  . Laparoscopy for ectopic pregnancy    . Dilation and curettage of uterus    . Adenoidectomy and myringotomy with tube placement     Family History  Problem Relation Age of Onset  . Hypertension Father   . Heart disease Father     Exam   BP 107/55 mmHg  Pulse 96  Temp(Src) 98.1 F (36.7 C)  Ht 5' 5.5" (1.664 m)  Wt 214 lb 8 oz (97.297 kg)  BMI 35.14 kg/m2  LMP 03/08/2014 Uterine Size: size equals dates  Pelvic Exam:    Perineum: No Hemorrhoids, Normal Perineum   Vulva: normal   Vagina:  normal mucosa, normal discharge, no palpable nodules   pH: Not done   Cervix: no bleeding following Pap, no cervical motion tenderness and no  lesions   Adnexa: normal adnexa and no mass, fullness, tenderness   Bony Pelvis: Adequate  System: Breast:  No nipple retraction or dimpling, No nipple discharge or bleeding, No axillary or supraclavicular adenopathy, Normal to palpation without dominant masses   Skin: normal coloration and turgor, no rashes    Neurologic: negative   Extremities: normal strength, tone, and muscle mass   HEENT neck supple with midline trachea and thyroid without masses   Mouth/Teeth mucous membranes moist, pharynx normal without lesions   Neck supple and no masses   Cardiovascular: regular rate and rhythm, no murmurs or gallops   Respiratory:  appears well, vitals normal, no respiratory distress, acyanotic, normal RR, neck free of mass or lymphadenopathy, chest clear, no wheezing, crepitations, rhonchi, normal symmetric air entry   Abdomen: soft, non-tender; bowel sounds normal; no masses,  no organomegaly   Urinary: urethral meatus normal     Assessment:    Pregnancy:  24 yo  [id number] at 8933w1d wks IUP  Patient Active Problem List   Diagnosis Date Noted  . Late prenatal care affecting pregnancy in third trimester, antepartum 11/30/2014  . Evaluate anatomy not seen on prior sonogram         Plan:     Initial labs drawn.  Pap smear w/ GC/CT; 1 hr.   Prenatal vitamins. Tdap today.   Problem list reviewed and updated. Genetic Screening:  Too late  Follow up in 1 weeks.  Marlis Edelson 11/30/2014

## 2014-11-30 NOTE — Progress Notes (Signed)
Reports occasional edema in hands and feet.  C/o of pelvic pressure and occasional contractions.  1hr gtt and pap today.  New OB packet given.

## 2014-11-30 NOTE — Patient Instructions (Signed)
Third Trimester of Pregnancy The third trimester is from week 29 through week 42, months 7 through 9. The third trimester is a time when the fetus is growing rapidly. At the end of the ninth month, the fetus is about 20 inches in length and weighs 6-10 pounds.  BODY CHANGES Your body goes through many changes during pregnancy. The changes vary from woman to woman.   Your weight will continue to increase. You can expect to gain 25-35 pounds (11-16 kg) by the end of the pregnancy.  You may begin to get stretch marks on your hips, abdomen, and breasts.  You may urinate more often because the fetus is moving lower into your pelvis and pressing on your bladder.  You may develop or continue to have heartburn as a result of your pregnancy.  You may develop constipation because certain hormones are causing the muscles that push waste through your intestines to slow down.  You may develop hemorrhoids or swollen, bulging veins (varicose veins).  You may have pelvic pain because of the weight gain and pregnancy hormones relaxing your joints between the bones in your pelvis. Backaches may result from overexertion of the muscles supporting your posture.  You may have changes in your hair. These can include thickening of your hair, rapid growth, and changes in texture. Some women also have hair loss during or after pregnancy, or hair that feels dry or thin. Your hair will most likely return to normal after your baby is born.  Your breasts will continue to grow and be tender. A yellow discharge may leak from your breasts called colostrum.  Your belly button may stick out.  You may feel short of breath because of your expanding uterus.  You may notice the fetus "dropping," or moving lower in your abdomen.  You may have a bloody mucus discharge. This usually occurs a few days to a week before labor begins.  Your cervix becomes thin and soft (effaced) near your due date. WHAT TO EXPECT AT YOUR PRENATAL  EXAMS  You will have prenatal exams every 2 weeks until week 36. Then, you will have weekly prenatal exams. During a routine prenatal visit:  You will be weighed to make sure you and the fetus are growing normally.  Your blood pressure is taken.  Your abdomen will be measured to track your baby's growth.  The fetal heartbeat will be listened to.  Any test results from the previous visit will be discussed.  You may have a cervical check near your due date to see if you have effaced. At around 36 weeks, your caregiver will check your cervix. At the same time, your caregiver will also perform a test on the secretions of the vaginal tissue. This test is to determine if a type of bacteria, Group B streptococcus, is present. Your caregiver will explain this further. Your caregiver may ask you:  What your birth plan is.  How you are feeling.  If you are feeling the baby move.  If you have had any abnormal symptoms, such as leaking fluid, bleeding, severe headaches, or abdominal cramping.  If you have any questions. Other tests or screenings that may be performed during your third trimester include:  Blood tests that check for low iron levels (anemia).  Fetal testing to check the health, activity level, and growth of the fetus. Testing is done if you have certain medical conditions or if there are problems during the pregnancy. FALSE LABOR You may feel small, irregular contractions that   eventually go away. These are called Braxton Hicks contractions, or false labor. Contractions may last for hours, days, or even weeks before true labor sets in. If contractions come at regular intervals, intensify, or become painful, it is best to be seen by your caregiver.  SIGNS OF LABOR   Menstrual-like cramps.  Contractions that are 5 minutes apart or less.  Contractions that start on the top of the uterus and spread down to the lower abdomen and back.  A sense of increased pelvic pressure or back  pain.  A watery or bloody mucus discharge that comes from the vagina. If you have any of these signs before the 37th week of pregnancy, call your caregiver right away. You need to go to the hospital to get checked immediately. HOME CARE INSTRUCTIONS   Avoid all smoking, herbs, alcohol, and unprescribed drugs. These chemicals affect the formation and growth of the baby.  Follow your caregiver's instructions regarding medicine use. There are medicines that are either safe or unsafe to take during pregnancy.  Exercise only as directed by your caregiver. Experiencing uterine cramps is a good sign to stop exercising.  Continue to eat regular, healthy meals.  Wear a good support bra for breast tenderness.  Do not use hot tubs, steam rooms, or saunas.  Wear your seat belt at all times when driving.  Avoid raw meat, uncooked cheese, cat litter boxes, and soil used by cats. These carry germs that can cause birth defects in the baby.  Take your prenatal vitamins.  Try taking a stool softener (if your caregiver approves) if you develop constipation. Eat more high-fiber foods, such as fresh vegetables or fruit and whole grains. Drink plenty of fluids to keep your urine clear or pale yellow.  Take warm sitz baths to soothe any pain or discomfort caused by hemorrhoids. Use hemorrhoid cream if your caregiver approves.  If you develop varicose veins, wear support hose. Elevate your feet for 15 minutes, 3-4 times a day. Limit salt in your diet.  Avoid heavy lifting, wear low heal shoes, and practice good posture.  Rest a lot with your legs elevated if you have leg cramps or low back pain.  Visit your dentist if you have not gone during your pregnancy. Use a soft toothbrush to brush your teeth and be gentle when you floss.  A sexual relationship may be continued unless your caregiver directs you otherwise.  Do not travel far distances unless it is absolutely necessary and only with the approval  of your caregiver.  Take prenatal classes to understand, practice, and ask questions about the labor and delivery.  Make a trial run to the hospital.  Pack your hospital bag.  Prepare the baby's nursery.  Continue to go to all your prenatal visits as directed by your caregiver. SEEK MEDICAL CARE IF:  You are unsure if you are in labor or if your water has broken.  You have dizziness.  You have mild pelvic cramps, pelvic pressure, or nagging pain in your abdominal area.  You have persistent nausea, vomiting, or diarrhea.  You have a bad smelling vaginal discharge.  You have pain with urination. SEEK IMMEDIATE MEDICAL CARE IF:   You have a fever.  You are leaking fluid from your vagina.  You have spotting or bleeding from your vagina.  You have severe abdominal cramping or pain.  You have rapid weight loss or gain.  You have shortness of breath with chest pain.  You notice sudden or extreme swelling   of your face, hands, ankles, feet, or legs.  You have not felt your baby move in over an hour.  You have severe headaches that do not go away with medicine.  You have vision changes. Document Released: 10/01/2001 Document Revised: 10/12/2013 Document Reviewed: 12/08/2012 Exit[employer] Patient Information 2015 [employer]. This information is not intended to replace advice given to you by your health care provider. Make sure you discuss any questions you have with your health care provider.  

## 2014-12-01 LAB — PRESCRIPTION MONITORING PROFILE (19 PANEL)
Amphetamine/Meth: NEGATIVE ng/mL
BARBITURATE SCREEN, URINE: NEGATIVE ng/mL
Benzodiazepine Screen, Urine: NEGATIVE ng/mL
Buprenorphine, Urine: NEGATIVE ng/mL
Cannabinoid Scrn, Ur: NEGATIVE ng/mL
Carisoprodol, Urine: NEGATIVE ng/mL
Cocaine Metabolites: NEGATIVE ng/mL
Creatinine, Urine: 138.73 mg/dL (ref >20.0)
FENTANYL URINE: NEGATIVE ng/mL
MDMA URINE: NEGATIVE ng/mL
Meperidine, Ur: NEGATIVE ng/mL
Methadone Screen, Urine: NEGATIVE ng/mL
Methaqualone: NEGATIVE ng/mL
Nitrites, Initial: NEGATIVE ug/mL
OPIATE SCREEN, URINE: NEGATIVE ng/mL
Oxycodone Screen, Ur: NEGATIVE ng/mL
Phencyclidine, Ur: NEGATIVE ng/mL
Propoxyphene: NEGATIVE ng/mL
TRAMADOL UR: NEGATIVE ng/mL
Tapentadol, urine: NEGATIVE ng/mL
Zolpidem, Urine: NEGATIVE ng/mL
pH, Initial: 6.4 pH (ref 4.5–8.9)

## 2014-12-01 LAB — ANTIBODY SCREEN: Antibody Screen: NEGATIVE

## 2014-12-01 LAB — CYTOLOGY - PAP

## 2014-12-02 LAB — CULTURE, OB URINE
COLONY COUNT: NO GROWTH
Organism ID, Bacteria: NO GROWTH

## 2014-12-06 ENCOUNTER — Encounter (HOSPITAL_COMMUNITY): Payer: Self-pay | Admitting: *Deleted

## 2014-12-06 ENCOUNTER — Inpatient Hospital Stay (HOSPITAL_COMMUNITY)
Admission: AD | Admit: 2014-12-06 | Discharge: 2014-12-06 | Disposition: A | Discharge status: Home / Self Care | Payer: Medicaid Other | Source: Ambulatory Visit | Attending: Obstetrics & Gynecology | Admitting: Obstetrics & Gynecology

## 2014-12-06 DIAGNOSIS — O471 False labor at or after 37 completed weeks of gestation: Secondary | ICD-10-CM | POA: Diagnosis present

## 2014-12-06 DIAGNOSIS — Z3A39 39 weeks gestation of pregnancy: Secondary | ICD-10-CM | POA: Diagnosis not present

## 2014-12-06 MED ORDER — OXYCODONE HCL 5 MG PO TABS
10.0000 mg | ORAL_TABLET | Freq: Once | ORAL | Status: AC
  Administered 2014-12-06: 10 mg via ORAL
  Filled 2014-12-06: qty 2

## 2014-12-06 NOTE — MAU Note (Signed)
Pt to be reexamined in 2 hours and may walk for some of that time.

## 2014-12-06 NOTE — MAU Note (Signed)
Pt states that contractions began 0000 consistently. Pt states that baby is active and denies bleeding and leaking of fluid.

## 2014-12-06 NOTE — MAU Note (Signed)
PT  SAYS  [facility]MN.  WAS 1 CM IN CLINIC ON Friday.    GBS- NEG

## 2014-12-06 NOTE — MAU Provider Note (Signed)
S: 24 y.o. [id number] @ 3759w0d with limited PNC but otherwise normal pregnancy presents to MAU for labor evaluation.  She reports cramping and contractions on and off for 1 week. She reports constant pelvic pressure and reports this has become intolerable.  She reports good fetal movement, denies LOF, vaginal bleeding, vaginal itching/burning, urinary symptoms, h/a, dizziness, n/v, or fever/chills.    O: BP 120/79 mmHg  Pulse 89  Temp(Src) 98 F (36.7 C)  Resp 18  Ht 5' 5.5" (1.664 m)  Wt 97.523 kg (215 lb)  BMI 35.22 kg/m2  LMP 03/08/2014   Dilation: 1.5 Effacement (%): 50 Cervical Position: Middle Station: -2 Presentation: Vertex Exam by:: Dorrene GermanJ. Lowe RN   Cervix unchanged in 2 hours in MAU  A: Threatened labor at term  P: Percocet 5/325 x 2 tabs in MAU for therapeutic rest D/C home Reassurance provided that pt and baby doing well, labor will begin soon Labor precautions given/reasons to return to MAU Keep scheduled appointments in [facility]  LEFTWICH-KIRBY, Donovon Micheletti, CNM 9:30 AM

## 2014-12-06 NOTE — Discharge Instructions (Signed)
Braxton Hicks Contractions °Contractions of the uterus can occur throughout pregnancy. Contractions are not always a sign that you are in labor.  °WHAT ARE BRAXTON HICKS CONTRACTIONS?  °Contractions that occur before labor are called Braxton Hicks contractions, or false labor. Toward the end of pregnancy (32-34 weeks), these contractions can develop more often and may become more forceful. This is not true labor because these contractions do not result in opening (dilatation) and thinning of the cervix. They are sometimes difficult to tell apart from true labor because these contractions can be forceful and people have different pain tolerances. You should not feel embarrassed if you go to the hospital with false labor. Sometimes, the only way to tell if you are in true labor is for your health care provider to look for changes in the cervix. °If there are no prenatal problems or other health problems associated with the pregnancy, it is completely safe to be sent home with false labor and await the onset of true labor. °HOW CAN YOU TELL THE DIFFERENCE BETWEEN TRUE AND FALSE LABOR? °False Labor °· The contractions of false labor are usually shorter and not as hard as those of true labor.   °· The contractions are usually irregular.   °· The contractions are often felt in the front of the lower abdomen and in the groin.   °· The contractions may go away when you walk around or change positions while lying down.   °· The contractions get weaker and are shorter lasting as time goes on.   °· The contractions do not usually become progressively stronger, regular, and closer together as with true labor.   °True Labor °· Contractions in true labor last 30-70 seconds, become very regular, usually become more intense, and increase in frequency.   °· The contractions do not go away with walking.   °· The discomfort is usually felt in the top of the uterus and spreads to the lower abdomen and low back.   °· True labor can be  determined by your health care provider with an exam. This will show that the cervix is dilating and getting thinner.   °WHAT TO REMEMBER °· Keep up with your usual exercises and follow other instructions given by your health care provider.   °· Take medicines as directed by your health care provider.   °· Keep your regular prenatal appointments.   °· Eat and drink lightly if you think you are going into labor.   °· If Braxton Hicks contractions are making you uncomfortable:   °¨ Change your position from lying down or resting to walking, or from walking to resting.   °¨ Sit and rest in a tub of warm water.   °¨ Drink 2-3 glasses of water. Dehydration may cause these contractions.   °¨ Do slow and deep breathing several times an hour.   °WHEN SHOULD I SEEK IMMEDIATE MEDICAL CARE? °Seek immediate medical care if: °· Your contractions become stronger, more regular, and closer together.   °· You have fluid leaking or gushing from your vagina.   °· You have a fever.    °· You have vaginal bleeding.   °· You have continuous abdominal pain.   °· You have low back pain that you never had before.   °· You feel your baby's head pushing down and causing pelvic pressure.   °· Your baby is not moving as much as it used to.   °Document Released: 10/07/2005 Document Revised: 10/12/2013 Document Reviewed: 07/19/2013 °Exit[employer]® Patient Information ©2015 [employer]. This information is not intended to replace advice given to you by your health care provider. Make sure you discuss any   questions you have with your health care provider. ° °

## 2014-12-09 ENCOUNTER — Ambulatory Visit (INDEPENDENT_AMBULATORY_CARE_PROVIDER_SITE_OTHER): Payer: Medicaid Other | Admitting: Certified Nurse Midwife

## 2014-12-09 VITALS — BP 124/81 | HR 90 | Wt 216.0 lb

## 2014-12-09 DIAGNOSIS — O0933 Supervision of pregnancy with insufficient antenatal care, third trimester: Secondary | ICD-10-CM

## 2014-12-09 DIAGNOSIS — Z3483 Encounter for supervision of other normal pregnancy, third trimester: Secondary | ICD-10-CM

## 2014-12-09 NOTE — Patient Instructions (Signed)
Braxton Hicks Contractions °Contractions of the uterus can occur throughout pregnancy. Contractions are not always a sign that you are in labor.  °WHAT ARE BRAXTON HICKS CONTRACTIONS?  °Contractions that occur before labor are called Braxton Hicks contractions, or false labor. Toward the end of pregnancy (32-34 weeks), these contractions can develop more often and may become more forceful. This is not true labor because these contractions do not result in opening (dilatation) and thinning of the cervix. They are sometimes difficult to tell apart from true labor because these contractions can be forceful and people have different pain tolerances. You should not feel embarrassed if you go to the hospital with false labor. Sometimes, the only way to tell if you are in true labor is for your health care provider to look for changes in the cervix. °If there are no prenatal problems or other health problems associated with the pregnancy, it is completely safe to be sent home with false labor and await the onset of true labor. °HOW CAN YOU TELL THE DIFFERENCE BETWEEN TRUE AND FALSE LABOR? °False Labor °· The contractions of false labor are usually shorter and not as hard as those of true labor.   °· The contractions are usually irregular.   °· The contractions are often felt in the front of the lower abdomen and in the groin.   °· The contractions may go away when you walk around or change positions while lying down.   °· The contractions get weaker and are shorter lasting as time goes on.   °· The contractions do not usually become progressively stronger, regular, and closer together as with true labor.   °True Labor °· Contractions in true labor last 30-70 seconds, become very regular, usually become more intense, and increase in frequency.   °· The contractions do not go away with walking.   °· The discomfort is usually felt in the top of the uterus and spreads to the lower abdomen and low back.   °· True labor can be  determined by your health care provider with an exam. This will show that the cervix is dilating and getting thinner.   °WHAT TO REMEMBER °· Keep up with your usual exercises and follow other instructions given by your health care provider.   °· Take medicines as directed by your health care provider.   °· Keep your regular prenatal appointments.   °· Eat and drink lightly if you think you are going into labor.   °· If Braxton Hicks contractions are making you uncomfortable:   °¨ Change your position from lying down or resting to walking, or from walking to resting.   °¨ Sit and rest in a tub of warm water.   °¨ Drink 2-3 glasses of water. Dehydration may cause these contractions.   °¨ Do slow and deep breathing several times an hour.   °WHEN SHOULD I SEEK IMMEDIATE MEDICAL CARE? °Seek immediate medical care if: °· Your contractions become stronger, more regular, and closer together.   °· You have fluid leaking or gushing from your vagina.   °· You have a fever.   °· You pass blood-tinged mucus.   °· You have vaginal bleeding.   °· You have continuous abdominal pain.   °· You have low back pain that you never had before.   °· You feel your baby's head pushing down and causing pelvic pressure.   °· Your baby is not moving as much as it used to.   °Document Released: 10/07/2005 Document Revised: 10/12/2013 Document Reviewed: 07/19/2013 °[employer]® Patient Information ©2015 [employer]. This information is not intended to replace advice given to you by your health care   provider. Make sure you discuss any questions you have with your health care provider. ° °

## 2014-12-09 NOTE — Progress Notes (Signed)
Pt wants induction. Will reevaluate cervix on Tuesday at 40 weeks.

## 2014-12-12 LAB — POCT URINALYSIS DIP (DEVICE)
Bilirubin Urine: NEGATIVE
Glucose, UA: NEGATIVE mg/dL
Hgb urine dipstick: NEGATIVE
Ketones, ur: NEGATIVE mg/dL
Leukocytes, UA: NEGATIVE
Nitrite: NEGATIVE
Protein, ur: NEGATIVE mg/dL
Specific Gravity, Urine: 1.01 (ref 1.005–1.030)
Urobilinogen, UA: 0.2 mg/dL (ref 0.0–1.0)
pH: 6 (ref 5.0–8.0)

## 2014-12-13 ENCOUNTER — Encounter: Payer: Self-pay | Admitting: *Deleted

## 2014-12-13 ENCOUNTER — Ambulatory Visit (INDEPENDENT_AMBULATORY_CARE_PROVIDER_SITE_OTHER): Payer: Medicaid Other | Admitting: Advanced Practice Midwife

## 2014-12-13 ENCOUNTER — Encounter: Payer: Self-pay | Admitting: Advanced Practice Midwife

## 2014-12-13 VITALS — BP 123/75 | HR 97 | Temp 98.0°F | Wt 219.7 lb

## 2014-12-13 DIAGNOSIS — Z3483 Encounter for supervision of other normal pregnancy, third trimester: Secondary | ICD-10-CM

## 2014-12-13 DIAGNOSIS — O0933 Supervision of pregnancy with insufficient antenatal care, third trimester: Secondary | ICD-10-CM

## 2014-12-13 LAB — POCT URINALYSIS DIP (DEVICE)
Bilirubin Urine: NEGATIVE
GLUCOSE, UA: NEGATIVE mg/dL
Hgb urine dipstick: NEGATIVE
Ketones, ur: NEGATIVE mg/dL
Leukocytes, UA: NEGATIVE
Nitrite: NEGATIVE
PH: 7.5 (ref 5.0–8.0)
Protein, ur: NEGATIVE mg/dL
Specific Gravity, Urine: 1.015 (ref 1.005–1.030)
UROBILINOGEN UA: 0.2 mg/dL (ref 0.0–1.0)

## 2014-12-13 NOTE — Patient Instructions (Signed)

## 2014-12-13 NOTE — Progress Notes (Signed)
Miserable. States was promised we would do it this week. Explained risks of IOL too early. Lots of UCs this week. Membranes swept.  D/W Dr Adrian BlackwaterStinson.  IOL scheduled for 12/16/14 at 0730

## 2014-12-15 ENCOUNTER — Encounter (HOSPITAL_COMMUNITY): Payer: Self-pay | Admitting: *Deleted

## 2014-12-15 ENCOUNTER — Telehealth (HOSPITAL_COMMUNITY): Payer: Self-pay | Admitting: *Deleted

## 2014-12-15 NOTE — Telephone Encounter (Signed)
Preadmission screen  

## 2014-12-16 ENCOUNTER — Encounter: Payer: Self-pay | Admitting: *Deleted

## 2014-12-16 ENCOUNTER — Inpatient Hospital Stay (HOSPITAL_COMMUNITY)
Admission: RE | Admit: 2014-12-16 | Discharge: 2014-12-18 | DRG: 775 | Disposition: A | Discharge status: Home / Self Care | Payer: Medicaid Other | Source: Ambulatory Visit | Attending: Obstetrics and Gynecology | Admitting: Obstetrics and Gynecology

## 2014-12-16 ENCOUNTER — Encounter (HOSPITAL_COMMUNITY): Payer: Self-pay

## 2014-12-16 DIAGNOSIS — Z833 Family history of diabetes mellitus: Secondary | ICD-10-CM | POA: Diagnosis not present

## 2014-12-16 DIAGNOSIS — Z8249 Family history of ischemic heart disease and other diseases of the circulatory system: Secondary | ICD-10-CM

## 2014-12-16 DIAGNOSIS — O99334 Smoking (tobacco) complicating childbirth: Secondary | ICD-10-CM | POA: Diagnosis present

## 2014-12-16 DIAGNOSIS — Z3A40 40 weeks gestation of pregnancy: Secondary | ICD-10-CM | POA: Diagnosis present

## 2014-12-16 DIAGNOSIS — F17210 Nicotine dependence, cigarettes, uncomplicated: Secondary | ICD-10-CM | POA: Diagnosis present

## 2014-12-16 DIAGNOSIS — O480 Post-term pregnancy: Principal | ICD-10-CM | POA: Diagnosis present

## 2014-12-16 DIAGNOSIS — O0933 Supervision of pregnancy with insufficient antenatal care, third trimester: Secondary | ICD-10-CM | POA: Diagnosis not present

## 2014-12-16 LAB — CBC
HCT: 31.6 % — ABNORMAL LOW (ref 36.0–46.0)
Hemoglobin: 10.7 g/dL — ABNORMAL LOW (ref 12.0–15.0)
MCH: 29.8 pg (ref 26.0–34.0)
MCHC: 33.9 g/dL (ref 30.0–36.0)
MCV: 88 fL (ref 78.0–100.0)
PLATELETS: 252 10*3/uL (ref 150–400)
RBC: 3.59 MIL/uL — AB (ref 3.87–5.11)
RDW: 14.8 % (ref 11.5–15.5)
WBC: 13.2 10*3/uL — AB (ref 4.0–10.5)

## 2014-12-16 LAB — TYPE AND SCREEN
ABO/RH(D): A POS
ANTIBODY SCREEN: NEGATIVE

## 2014-12-16 MED ORDER — ONDANSETRON HCL 4 MG/2ML IJ SOLN: 4.0000 mg | Freq: Four times a day (QID) | INTRAMUSCULAR | Status: DC | PRN

## 2014-12-16 MED ORDER — OXYTOCIN 40 UNITS IN LACTATED RINGERS INFUSION - SIMPLE MED
1.0000 m[IU]/min | INTRAVENOUS | Status: DC
  Administered 2014-12-16: 2 m[IU]/min via INTRAVENOUS

## 2014-12-16 MED ORDER — EPHEDRINE 5 MG/ML INJ
10.0000 mg | INTRAVENOUS | Status: DC | PRN
  Filled 2014-12-16: qty 2

## 2014-12-16 MED ORDER — ACETAMINOPHEN 325 MG PO TABS: 650.0000 mg | ORAL_TABLET | ORAL | Status: DC | PRN

## 2014-12-16 MED ORDER — LACTATED RINGERS IV SOLN
INTRAVENOUS | Status: DC
  Administered 2014-12-16 – 2014-12-17 (×2): via INTRAVENOUS

## 2014-12-16 MED ORDER — BUTORPHANOL TARTRATE 1 MG/ML IJ SOLN
1.0000 mg | INTRAMUSCULAR | Status: DC | PRN
  Administered 2014-12-16 (×4): 1 mg via INTRAVENOUS
  Filled 2014-12-16 (×4): qty 1

## 2014-12-16 MED ORDER — TERBUTALINE SULFATE 1 MG/ML IJ SOLN: 0.2500 mg | Freq: Once | INTRAMUSCULAR | Status: AC | PRN

## 2014-12-16 MED ORDER — DIPHENHYDRAMINE HCL 50 MG/ML IJ SOLN: 12.5000 mg | INTRAMUSCULAR | Status: DC | PRN

## 2014-12-16 MED ORDER — PHENYLEPHRINE 40 MCG/ML (10ML) SYRINGE FOR IV PUSH (FOR BLOOD PRESSURE SUPPORT)
80.0000 ug | PREFILLED_SYRINGE | INTRAVENOUS | Status: DC | PRN
  Filled 2014-12-16: qty 2

## 2014-12-16 MED ORDER — LACTATED RINGERS IV SOLN: 500.0000 mL | INTRAVENOUS | Status: DC | PRN

## 2014-12-16 MED ORDER — OXYCODONE-ACETAMINOPHEN 5-325 MG PO TABS: 1.0000 | ORAL_TABLET | ORAL | Status: DC | PRN

## 2014-12-16 MED ORDER — LIDOCAINE HCL (PF) 1 % IJ SOLN
30.0000 mL | INTRAMUSCULAR | Status: DC | PRN
  Filled 2014-12-16: qty 30

## 2014-12-16 MED ORDER — OXYTOCIN 40 UNITS IN LACTATED RINGERS INFUSION - SIMPLE MED
62.5000 mL/h | INTRAVENOUS | Status: DC
  Filled 2014-12-16: qty 1000

## 2014-12-16 MED ORDER — LACTATED RINGERS IV SOLN
500.0000 mL | Freq: Once | INTRAVENOUS | Status: AC
  Administered 2014-12-16: 500 mL via INTRAVENOUS

## 2014-12-16 MED ORDER — DIPHENHYDRAMINE HCL 50 MG/ML IJ SOLN
25.0000 mg | Freq: Four times a day (QID) | INTRAMUSCULAR | Status: DC | PRN
  Administered 2014-12-16: 25 mg via INTRAVENOUS
  Filled 2014-12-16: qty 1

## 2014-12-16 MED ORDER — PHENYLEPHRINE 40 MCG/ML (10ML) SYRINGE FOR IV PUSH (FOR BLOOD PRESSURE SUPPORT)
80.0000 ug | PREFILLED_SYRINGE | INTRAVENOUS | Status: DC | PRN
  Filled 2014-12-16: qty 2
  Filled 2014-12-16: qty 20

## 2014-12-16 MED ORDER — FENTANYL 2.5 MCG/ML BUPIVACAINE 1/10 % EPIDURAL INFUSION (WH - ANES)
14.0000 mL/h | INTRAMUSCULAR | Status: DC | PRN
  Administered 2014-12-17: 14 mL/h via EPIDURAL
  Filled 2014-12-16: qty 125

## 2014-12-16 MED ORDER — OXYCODONE-ACETAMINOPHEN 5-325 MG PO TABS: 2.0000 | ORAL_TABLET | ORAL | Status: DC | PRN

## 2014-12-16 MED ORDER — CITRIC ACID-SODIUM CITRATE 334-500 MG/5ML PO SOLN: 30.0000 mL | ORAL | Status: DC | PRN

## 2014-12-16 MED ORDER — MISOPROSTOL 25 MCG QUARTER TABLET
25.0000 ug | ORAL_TABLET | ORAL | Status: DC | PRN
  Administered 2014-12-16: 25 ug via VAGINAL
  Filled 2014-12-16: qty 1
  Filled 2014-12-16: qty 0.25

## 2014-12-16 MED ORDER — OXYTOCIN BOLUS FROM INFUSION
500.0000 mL | INTRAVENOUS | Status: DC
  Administered 2014-12-17: 500 mL via INTRAVENOUS

## 2014-12-16 NOTE — H&P (Signed)
LABOR ADMISSION HISTORY AND PHYSICAL  Alexandra Moreno is a 24 y.o. female [phone] with IUP at [redacted]w[redacted]d by [facility] [place] presenting for Induction of Labor. She reports +FMs, No LOF, no VB, no blurry vision, headaches or peripheral edema, and RUQ pain.  Reports history of 5 pregnancies including her current pregnancy, with two vaginal births with no complications, one miscarriage at 19weeks, and one ectopic. No history of C-section. Denies any complications with current pregnancy. She plans on breast feeding. She request Depo with transition to Nexplanon for birth control.  Prenatal History/Complications:  Past Medical History: Past Medical History  Diagnosis Date  . Ectopic pregnancy   . Asthma   . Mental disorder   . Depression   . Anxiety   . Manic-depressive   . Emphysema lung     Sees a pulmologist currently being tested for emphysema    Past Surgical History: Past Surgical History  Procedure Laterality Date  . Laparoscopy for ectopic pregnancy    . Dilation and curettage of uterus    . Adenoidectomy and myringotomy with tube placement      Obstetrical History: OB History    Gravida Para Term Preterm AB TAB SAB Ectopic Multiple Living   0 Social History: History   Social History  . Marital Status: Single    Spouse Name: N/A  . Number of Children: N/A  . Years of Education: N/A   Social History Main Topics  . Smoking status: Current Every Day Smoker -- 0.50 packs/day    Types: Cigarettes    Last Attempt to Quit: 10/27/2014  . Smokeless tobacco: Never Used  . Alcohol Use: No     Comment: social before pregnancy  . Drug Use: No  . Sexual Activity: Yes   Other Topics Concern  . Not on file   Social History Narrative    Family History: Family History  Problem Relation Age of Onset  . Hypertension Father   . Heart disease Father   . Diabetes Paternal Grandfather     Allergies: Allergies  Allergen Reactions  . Tylenol [Acetaminophen]  Other (See Comments)    "Jittery, lethargic"    Prescriptions prior to admission  Medication Sig Dispense Refill Last Dose  . Omega-3 Fatty Acids (FISH OIL PO) Take by mouth.   Taking  . Prenatal Vit-Fe Fumarate-FA (PRENATAL MULTIVITAMIN) TABS tablet Take 1 tablet by mouth daily at 12 noon.   Taking    Review of Systems   All systems reviewed and negative except as stated in HPI  Temperature 98.4 F (36.9 C), temperature source Oral, resp. rate 18, height 5' 5.5" (1.664 m), weight 99.338 kg (219 lb), last menstrual period 03/08/2014. General appearance: alert, cooperative and no distress Lungs: clear to auscultation bilaterally Heart: regular rate and rhythm Abdomen: soft, non-tender; bowel sounds normal Extremities: Homans sign is negative, no sign of DVT Presentation: cephalic Fetal monitoringBaseline: 140 bpm, Variability: Good {> 6 bpm), Accelerations: Reactive and Decelerations: Absent Uterine activityNone Dilation: Fingertip Effacement (%): Thick Cervical Position: Posterior Station: -3 Presentation: Vertex Exam by:: Paul Dykes RNC     Prenatal labs: ABO, Rh: --/--/A POS (09/18 2120) Antibody: NEG (02/10 1030) Rubella:   RPR: Non Reactive (01/28 2208)  HBsAg: NEGATIVE (01/28 2208)  HIV: NONREACTIVE (02/10 1221)  GBS: Negative (01/28 0000)  1 hr Glucola: 134 Genetic screening: Too Late Anatomy US: Normal at 28 weeks  Clinic  [facility] clinic  Prenatal Labs  Dating  LMP, consistent with 20 wk us Blood type: --/--/A POS (09/18 2120)   Genetic Screen  Too late Antibody: ordered 11/30/14  Anatomic US  Nml @ 20wks, but limited > rescan 28 wks nml Rubella: 1.01 (01/28 2208)  GTT Early:               Third trimester: 134 RPR: Non Reactive (01/28 2208)   Flu vaccine  Decline HBsAg: NEGATIVE (01/28 2208)   TDaP vaccine     11/30/14                                           Rhogam: HIV: NONREACTIVE (09/18 2120)   GBS        Neg                                      (For PCN  allergy, check sensitivities) GBS: Neg  Contraception  Depo > Nexplanon Pap: Negative; GC/CT neg x 2  Baby Food  Undecided   Circumcision  Undecided; outpatient preferred   Pediatrician  [facility]   Support Person  Loraine LericheMark and Betha Loaunt Angela    No results found for this or any previous visit (from the past 24 hour(s)).  Patient Active Problem List   Diagnosis Date Noted  . Late prenatal care affecting pregnancy in third trimester, antepartum 11/30/2014  . Evaluate anatomy not seen on prior sonogram    Assessment: Alexandra Moreno is a 24 y.o. [id number] at 8829w3d here for Induction of Labor. - Induce Labor. Cytotec. Once cervix more favorable will transition to Pitocin. - Regular diet. Counseled on light meals. Will transition to Clear Liquid Diet once Pitocin initiated.  - Epidural as indicated. Acetaminophen ordered PRN, but use with caution due to noted allergy (jittery, lethargy). - GBS negative - Plans to breast feed - Undecided contraception - Outpatient circumcision  Araceli BoucheRumley, Hamilton N 12/16/2014, 8:42 AM   OB fellow attestation:  I have seen and examined this patient; I agree with above documentation in the resident's note.   Alexandra Moreno is a 24 y.o. [id number] here for IOL 2/2 prolonged pregnancy, pt request of early induction  PE: BP 118/73 mmHg  Pulse 83  Temp(Src) 98.5 F (36.9 C) (Oral)  Resp 18  Ht 5' 5.5" (1.664 m)  Wt 219 lb (99.338 kg)  BMI 35.88 kg/m2  LMP 03/08/2014 Gen: calm comfortable, NAD Resp: normal effort, no distress Abd: gravid  ROS, labs, PMH reviewed  Plan: Agree with plan above, cytotec and FB when able  ACOSTA,KRISTY ROCIO 12/16/2014, 3:11 PM       `````Attestation of Attending Supervision of Advanced Practitioner: Evaluation and management procedures were performed by the PA/NP/CNM/OB Fellow under my supervision/collaboration. Chart reviewed and agree with management and plan.   Jayshawn Colston V 12/17/2014 1:36 AM

## 2014-12-17 ENCOUNTER — Encounter (HOSPITAL_COMMUNITY): Payer: Self-pay

## 2014-12-17 ENCOUNTER — Inpatient Hospital Stay (HOSPITAL_COMMUNITY): Payer: Medicaid Other | Admitting: Anesthesiology

## 2014-12-17 DIAGNOSIS — Z3A40 40 weeks gestation of pregnancy: Secondary | ICD-10-CM

## 2014-12-17 LAB — RPR: RPR: NONREACTIVE

## 2014-12-17 MED ORDER — IBUPROFEN 600 MG PO TABS
600.0000 mg | ORAL_TABLET | Freq: Four times a day (QID) | ORAL | Status: DC
  Administered 2014-12-17 – 2014-12-18 (×5): 600 mg via ORAL
  Filled 2014-12-17 (×5): qty 1

## 2014-12-17 MED ORDER — SODIUM CHLORIDE 0.9 % IJ SOLN: 3.0000 mL | Freq: Two times a day (BID) | INTRAMUSCULAR | Status: DC

## 2014-12-17 MED ORDER — FENTANYL 2.5 MCG/ML BUPIVACAINE 1/10 % EPIDURAL INFUSION (WH - ANES)
INTRAMUSCULAR | Status: DC | PRN
  Administered 2014-12-17: 12 mL/h via EPIDURAL

## 2014-12-17 MED ORDER — SIMETHICONE 80 MG PO CHEW
80.0000 mg | CHEWABLE_TABLET | ORAL | Status: DC | PRN
  Administered 2014-12-18: 80 mg via ORAL
  Filled 2014-12-17: qty 1

## 2014-12-17 MED ORDER — LIDOCAINE-EPINEPHRINE (PF) 1.5 %-1:200000 IJ SOLN
INTRAMUSCULAR | Status: DC | PRN
  Administered 2014-12-17: 4 mL

## 2014-12-17 MED ORDER — DIBUCAINE 1 % RE OINT: 1.0000 "application " | TOPICAL_OINTMENT | RECTAL | Status: DC | PRN

## 2014-12-17 MED ORDER — ZOLPIDEM TARTRATE 5 MG PO TABS: 5.0000 mg | ORAL_TABLET | Freq: Every evening | ORAL | Status: DC | PRN

## 2014-12-17 MED ORDER — DIPHENHYDRAMINE HCL 25 MG PO CAPS: 25.0000 mg | ORAL_CAPSULE | Freq: Four times a day (QID) | ORAL | Status: DC | PRN

## 2014-12-17 MED ORDER — ONDANSETRON HCL 4 MG PO TABS: 4.0000 mg | ORAL_TABLET | ORAL | Status: DC | PRN

## 2014-12-17 MED ORDER — ZOLPIDEM TARTRATE 5 MG PO TABS
5.0000 mg | ORAL_TABLET | Freq: Every evening | ORAL | Status: DC | PRN
Start: 2014-12-17 — End: 2014-12-17
  Administered 2014-12-17: 5 mg via ORAL
  Filled 2014-12-17: qty 1

## 2014-12-17 MED ORDER — BUPIVACAINE HCL (PF) 0.25 % IJ SOLN
INTRAMUSCULAR | Status: DC | PRN
  Administered 2014-12-17 (×2): 4 mL via EPIDURAL

## 2014-12-17 MED ORDER — ONDANSETRON HCL 4 MG/2ML IJ SOLN: 4.0000 mg | INTRAMUSCULAR | Status: DC | PRN

## 2014-12-17 MED ORDER — FENTANYL CITRATE 0.05 MG/ML IJ SOLN: 12.5000 ug | INTRAMUSCULAR | Status: DC | PRN

## 2014-12-17 MED ORDER — SENNOSIDES-DOCUSATE SODIUM 8.6-50 MG PO TABS: 2.0000 | ORAL_TABLET | ORAL | Status: DC

## 2014-12-17 MED ORDER — FENTANYL CITRATE 0.05 MG/ML IJ SOLN
12.5000 ug | Freq: Once | INTRAMUSCULAR | Status: DC
  Filled 2014-12-17: qty 2

## 2014-12-17 MED ORDER — SODIUM CHLORIDE 0.9 % IJ SOLN: 3.0000 mL | INTRAMUSCULAR | Status: DC | PRN

## 2014-12-17 MED ORDER — SODIUM CHLORIDE 0.9 % IV SOLN: 250.0000 mL | INTRAVENOUS | Status: DC | PRN

## 2014-12-17 MED ORDER — BENZOCAINE-MENTHOL 20-0.5 % EX AERO: 1.0000 "application " | INHALATION_SPRAY | CUTANEOUS | Status: DC | PRN

## 2014-12-17 MED ORDER — OXYCODONE HCL 5 MG PO TABS
5.0000 mg | ORAL_TABLET | ORAL | Status: DC | PRN
  Administered 2014-12-17 (×2): 5 mg via ORAL
  Filled 2014-12-17 (×2): qty 1

## 2014-12-17 MED ORDER — WITCH HAZEL-GLYCERIN EX PADS: 1.0000 "application " | MEDICATED_PAD | CUTANEOUS | Status: DC | PRN

## 2014-12-17 MED ORDER — OXYTOCIN 40 UNITS IN LACTATED RINGERS INFUSION - SIMPLE MED: 62.5000 mL/h | INTRAVENOUS | Status: DC | PRN

## 2014-12-17 MED ORDER — BISACODYL 10 MG RE SUPP: 10.0000 mg | Freq: Every day | RECTAL | Status: DC | PRN

## 2014-12-17 MED ORDER — PRENATAL MULTIVITAMIN CH
1.0000 | ORAL_TABLET | Freq: Every day | ORAL | Status: DC
  Administered 2014-12-17 – 2014-12-18 (×2): 1 via ORAL
  Filled 2014-12-17 (×2): qty 1

## 2014-12-17 MED ORDER — LANOLIN HYDROUS EX OINT: TOPICAL_OINTMENT | CUTANEOUS | Status: DC | PRN

## 2014-12-17 MED ORDER — FLEET ENEMA 7-19 GM/118ML RE ENEM: 1.0000 | ENEMA | Freq: Every day | RECTAL | Status: DC | PRN

## 2014-12-17 NOTE — Anesthesia Procedure Notes (Signed)
Epidural Patient location during procedure: OB  Staffing Anesthesiologist: Amaira Safley, CHRIS Performed by: anesthesiologist   Preanesthetic Checklist Completed: patient identified, surgical consent, pre-op evaluation, timeout performed, IV checked, risks and benefits discussed and monitors and equipment checked  Epidural Patient position: sitting Prep: site prepped and draped and DuraPrep Patient monitoring: heart rate, cardiac monitor, continuous pulse ox and blood pressure Approach: midline Location: L4-L5 Injection technique: LOR saline  Needle:  Needle type: Tuohy  Needle gauge: 17 G Needle length: 9 cm Needle insertion depth: 6 cm Catheter type: closed end flexible Catheter size: 19 Gauge Catheter at skin depth: 13 cm Test dose: negative and 1.5% lidocaine with Epi 1:200 K  Assessment Events: blood not aspirated, injection not painful, no injection resistance, negative IV test and no paresthesia  Additional Notes H+P and labs checked, risks and benefits discussed with the patient, consent obtained, procedure tolerated well and without complications.  Reason for block:procedure for pain   

## 2014-12-17 NOTE — Lactation Note (Signed)
This note was copied from the chart of [facility]. Lactation Consultation Note  Patient Name: Boy Fredonia HighlandHayley Millen WUJWJ'XToday's Date: 12/17/2014 Reason for consult: Initial assessment (mom is breast /bottle, mom aware to call for feeding assessment )  Baby is 7 hours old and has been to the breast for 10 mins , and 5 mins x2 , last feeding was at 1140 - formula. Per mom presently not feeling well due to pain, LC suggested to call her RN and not to allow pain to get out of control. Mother informed of post-discharge support and given phone number to the lactation department, including services for phone call  assistance; out-patient appointments; and breastfeeding support group. List of other breastfeeding resources in the community given  in the handout. Encouraged mother to call for problems or concerns related to breastfeeding. Grand mother mentioned her daughter and tried a few times at the breast and not sure if she is going to continue. Not sure if she really likes it.  Presently not sure. LC encouraged to page for assistance if mom desire to latch.       Maternal Data    Feeding / per mom and grandmother , baby recently had some formula @1140     LATCH Score/Interventions                      Lactation Tools Discussed/Used WIC Program: [facility] ([facility]   Consult Status Consult Status: Follow-up Date: 12/17/14 Follow-up type: In-patient    Kathrin Greathouseorio, Deklyn Trachtenberg Ann 12/17/2014, 12:35 PM

## 2014-12-17 NOTE — Anesthesia Postprocedure Evaluation (Signed)
  Anesthesia Post-op Note  Patient: Alexandra Moreno  Procedure(s) Performed: * No procedures listed *  Patient Location: Mother/Baby  Anesthesia Type:Epidural  Level of Consciousness: awake  Airway and Oxygen Therapy: Patient Spontanous Breathing  Post-op Pain: mild  Post-op Assessment: Patient's Cardiovascular Status Stable and Respiratory Function Stable  Post-op Vital Signs: stable  Last Vitals:  Filed Vitals:   12/17/14 1330  BP: 120/84  Pulse: 94  Temp: 36.8 C  Resp: 20    Complications: No apparent anesthesia complications

## 2014-12-17 NOTE — Anesthesia Preprocedure Evaluation (Signed)
Anesthesia Evaluation  Patient identified by MRN, date of birth, ID band Patient awake    Reviewed: Allergy & Precautions, NPO status , Patient's Chart, lab work & pertinent test results  History of Anesthesia Complications Negative for: history of anesthetic complications  Airway Mallampati: II  TM Distance: >3 FB Neck ROM: Full    Dental  (+) Teeth Intact   Pulmonary neg shortness of breath, asthma , neg sleep apnea, COPDneg recent URI, Current Smoker,          Cardiovascular negative cardio ROS  Rhythm:Regular     Neuro/Psych PSYCHIATRIC DISORDERS Anxiety Depression Bipolar Disorder negative neurological ROS     GI/Hepatic negative GI ROS, Neg liver ROS,   Endo/Other  negative endocrine ROS  Renal/GU negative Renal ROS     Musculoskeletal   Abdominal   Peds  Hematology  (+) anemia ,   Anesthesia Other Findings   Reproductive/Obstetrics (+) Pregnancy                             Anesthesia Physical Anesthesia Plan  ASA: II  Anesthesia Plan: Epidural   Post-op Pain Management:    Induction:   Airway Management Planned:   Additional Equipment:   Intra-op Plan:   Post-operative Plan:   Informed Consent: I have reviewed the patients History and Physical, chart, labs and discussed the procedure including the risks, benefits and alternatives for the proposed anesthesia with the patient or authorized representative who has indicated his/her understanding and acceptance.     Plan Discussed with: Anesthesiologist  Anesthesia Plan Comments:         Anesthesia Quick Evaluation

## 2014-12-17 NOTE — Psychosocial Assessment (Addendum)
Pt crying and arguing with FOB, pt asked if shes's ok, pt states "yes".  FOB followed rn and asked [facility], question answered.

## 2014-12-17 NOTE — Psychosocial Assessment (Signed)
Pt stopped crying and arguing

## 2014-12-17 NOTE — Progress Notes (Signed)
Pt c/o of possible srom, no fluid noted on pad or perineum.  Fern negative

## 2014-12-18 ENCOUNTER — Ambulatory Visit: Payer: Self-pay

## 2014-12-18 MED ORDER — IBUPROFEN 600 MG PO TABS: 600.0000 mg | ORAL_TABLET | Freq: Four times a day (QID) | ORAL | Status: DC

## 2014-12-18 NOTE — Progress Notes (Signed)
Clinical Social Work Department PSYCHOSOCIAL ASSESSMENT - MATERNAL/CHILD 12/18/2014  Patient:  Alexandra Moreno,Alexandra Moreno  Account Number:  [id number]  Admit Date:  12/16/2014  Childs Name:   Donovan [name]    Clinical Social Worker:  CUMI BEVEL, LCSW   Date/Time:  12/18/2014 09:00 AM  Date Referred:  12/17/2014   Referral source  Central Nursery     Referred reason  Depression/Anxiety   Other referral source:    I:  FAMILY / HOME ENVIRONMENT Child's legal guardian:  PARENT  Guardian - Name Guardian - Age Guardian - Address  Alexandra Moreno, Anaija [address]  [place], [place] [address]  Alexandra Moreno 20    Other household support members/support persons Other support:    II  PSYCHOSOCIAL DATA Information Source:    Financial and Community Resources Employment:   FOB is employed   Financial resources:  Medicaid If Medicaid - County:   Other  Food Stamps  WIC   School / Grade:   Maternity Care Coordinator / Child Services Coordination / Early Interventions:  Cultural issues impacting care:    III  STRENGTHS Strengths  Supportive family/friends  Home prepared for Child (including basic supplies)  Adequate Resources   Strength comment:    IV  RISK FACTORS AND CURRENT PROBLEMS Current Problem:       V  SOCIAL WORK ASSESSMENT Acknowledged order for social work consult to assess mother's hx of depression. Staff also noted that mother was having "FOB issues and family issues"  Met with mother who was pleasant and receptive to social work.   FOB later joined the discussion.    Parents are not married, and mother has two other dependents ages 6 and 2.  Informed that they were residing together, but decided to separate. Mother states that having relationship issues and didn't want the children to witness them arguing.  Informed that although separated, they are working towards reconciliation.   Mother reports hx of anxiety and depression.  Informed that she was hospitalized at a psychiatric  facility in 2007.  Between the ages of 11 to 13, she reportedly received sporadic out-patient therapy. She was also tried on psychiatric medication which she noted was initially effective but lost it's potency, therefore she stop taking the medication.  Informed that she has been off psychiatric meds for the past 3 years. She denies current symptoms of depression or anxiety. Spoke with mother regarding the late PNC.  Informed that she had late PNC because she did not have pregnancy Medicaid, and could not afford the visit.  She denies any hx of illicit drug use.  UDS on newborn pending.  Spoke with mother's nurse and informed that there has been no concerns noted between the parents since mother was transferred from [place], and both maternal and paternal relatives have visited without incident.   No acute social concerns noted or reported at this time.  Mother informed of social work availability.      VI SOCIAL WORK PLAN Social Work Plan   No Barriers to Discharge   Type of pt/family education:   PP Depression informaton and resources   If child protective services report - county:   If child protective services report - date:   Information/referral to community resources comment:   Other social work plan:   Will monitor drug screen     

## 2014-12-18 NOTE — Lactation Note (Signed)
This note was copied from the chart of [facility]. Lactation Consultation Note  Patient Name: Alexandra Fredonia HighlandHayley Millen WUJWJ'XToday's Date: 12/18/2014 Reason for consult: Follow-up assessment Baby 41 hours of life. Mom and baby sleeping. Family member reports that mom came in wanting to bottle-feed formula, but is now trying to breastfeed. Mom has some nipple soreness and is using comfort gels. Would like assist with latching later when mom and baby awake and baby cueing to feed. Will pass this along to night LC, Vernona RiegerLaura. Enc to call for assistance from nurse if [facility] not [facility] when needed.   Maternal Data Does the patient have breastfeeding experience prior to this delivery?: No  Feeding Feeding Type: Breast Fed Length of feed: 15 min  LATCH Score/Interventions Latch: Grasps breast easily, tongue down, lips flanged, rhythmical sucking.  Audible Swallowing: A few with stimulation  Type of Nipple: Everted at rest and after stimulation  Comfort (Breast/Nipple): Filling, red/small blisters or bruises, mild/mod discomfort  Problem noted: Mild/Moderate discomfort  Hold (Positioning): No assistance needed to correctly position infant at breast.  LATCH Score: 8  Lactation Tools Discussed/Used     Consult Status Consult Status: Follow-up Date: 12/18/14 Follow-up type: In-patient    Geralynn OchsWILLIARD, Alora Gorey 12/18/2014, 10:44 PM

## 2014-12-18 NOTE — Discharge Summary (Signed)
Obstetric Discharge Summary Reason for Admission: induction of labor Prenatal Procedures: NST Intrapartum Procedures: spontaneous vaginal delivery Postpartum Procedures: none Complications-Operative and Postpartum: none HEMOGLOBIN  Date Value Ref Range Status  12/16/2014 10.7* 12.0 - 15.0 g/dL Final   HCT  Date Value Ref Range Status  12/16/2014 31.6* 36.0 - 46.0 % Final    Physical Exam:  General: alert, cooperative and no distress Lochia: appropriate Uterine Fundus: firm Incision: n/a DVT Evaluation: No evidence of DVT seen on physical exam. No cords or calf tenderness. No significant calf/ankle edema.  Discharge Diagnoses: Term Pregnancy-delivered  Discharge Information: Date: 12/18/2014 Activity: pelvic rest Diet: routine Medications: PNV and Ibuprofen Condition: stable Instructions: refer to practice specific booklet Discharge to: home Follow-up Information    [facility] Schedule an appointment as soon as possible for a visit in 6 weeks.   Contact information:   [address] [place] [id number] [phone]      Newborn Data: Live born female  Birth Weight: 6 lb 14.2 oz (3125 g) APGAR: 9, 9  Home with mother.  Alexandra Moreno, Alexandra 12/18/2014, 6:46 AM   I spoke with and examined patient and agree with resident/PA/SNM's note and plan of care.  Breastfeeding, plans for nexplanon for contraception- to remain abstinent until placement, OP circ Cheral MarkerKimberly R. Booker, CNM, [facility] 12/18/2014 7:57 AM

## 2014-12-18 NOTE — Discharge Instructions (Signed)

## 2014-12-19 ENCOUNTER — Ambulatory Visit: Payer: Self-pay

## 2014-12-19 NOTE — Progress Notes (Signed)
Ur chart review completed.  

## 2014-12-19 NOTE — Lactation Note (Signed)
This note was copied from the chart of Alexandra [facility] F[facility]ley Moreno. Lactation Consultation Note" Follow up visit with mom. She reports that her breasts and nipples are hurting with breast feeding. Baby awake and fussy- offered assist with latch. Mom reports breasts are feeling much fuller this morning. Baby latched well- mom needed assistance with positioning of her hands to hold the breast so baby won't slide off. Mom reports pain the first few sucks then eases off. Baby nursed for 5 min then off to sleep. Assisted mom with DEBP to soften breast. Family member states she has a pump mom can borrow. Mom pumped for 15 min and obtained about 15 cc's Reviewed engorgement prevention  and treatment. Ice packs given for mom to put on. Has comfort gels. No further questions at present. To call prn  Patient Name: Alexandra Moreno ZOXWR'UToday's Date: 12/19/2014 Reason for consult: Follow-up assessment   Maternal Data Formula Feeding for Exclusion: No Has patient been taught Hand Expression?: Yes Does the patient have breastfeeding experience prior to this delivery?: No  Feeding Feeding Type: Breast Fed Length of feed: 5 min  LATCH Score/Interventions Latch: Grasps breast easily, tongue down, lips flanged, rhythmical sucking.  Audible Swallowing: A few with stimulation  Type of Nipple: Everted at rest and after stimulation  Comfort (Breast/Nipple): Filling, red/small blisters or bruises, mild/mod discomfort  Problem noted: Mild/Moderate discomfort;Filling Interventions (Mild/moderate discomfort): Comfort gels  Hold (Positioning): Assistance needed to correctly position infant at breast and maintain latch. Intervention(s): Breastfeeding basics reviewed  LATCH Score: 7  Lactation Tools Discussed/Used Pump Review: Setup, frequency, and cleaning Initiated by:: DW Date initiated:: 12/19/14   Consult Status Consult Status: Complete    Pamelia HoitWeeks, Devun Anna D 12/19/2014, 9:59 AM

## 2014-12-22 ENCOUNTER — Telehealth: Payer: Self-pay | Admitting: *Deleted

## 2014-12-22 NOTE — Telephone Encounter (Signed)
Alexandra Moreno called and left a voicemail that she delivered 12/17/14 and while delivering heard and felt a lot of cracking and popping.  States it was so loud that her Mom and everyone in the room heard it . States it was coming from " down there" . States she talked with nurse and they couldn't explain it.  States she knows it has been a couple of days since she has been home but there is a lot of downward pressure/ pulling from stomach to groin and it is getting worse. States it hurts to lift her feet up to walk, or to lie down or sit and it is getting worse.   Per chart review had vaginal delivery 12/17/14, discharged 12/18/14. 3rd baby . Called [name] and she states she is not using any meds because she is breastfeeding. States this baby is smaller than either of her other children. States she is afraid something is torn inside because of the pain=7 when trying to walk.  We discussed is ok to take ibruprofen only as needed. I gave her an appointment to be evaluated tomorrow due to her concerns something is wrong and getting worse.

## 2014-12-23 ENCOUNTER — Ambulatory Visit: Payer: Self-pay | Admitting: Physician Assistant

## 2015-01-27 ENCOUNTER — Ambulatory Visit: Payer: Medicaid Other | Admitting: Medical

## 2018-09-02 ENCOUNTER — Encounter (HOSPITAL_COMMUNITY): Payer: Self-pay

## 2018-09-02 ENCOUNTER — Emergency Department (HOSPITAL_COMMUNITY): Payer: Self-pay

## 2018-09-02 ENCOUNTER — Other Ambulatory Visit: Payer: Self-pay

## 2018-09-02 ENCOUNTER — Emergency Department (HOSPITAL_COMMUNITY)
Admission: EM | Admit: 2018-09-02 | Discharge: 2018-09-03 | Disposition: A | Discharge status: Home / Self Care | Payer: Self-pay | Attending: Emergency Medicine | Admitting: Emergency Medicine

## 2018-09-02 DIAGNOSIS — K047 Periapical abscess without sinus: Secondary | ICD-10-CM | POA: Insufficient documentation

## 2018-09-02 DIAGNOSIS — L03211 Cellulitis of face: Secondary | ICD-10-CM | POA: Insufficient documentation

## 2018-09-02 DIAGNOSIS — K029 Dental caries, unspecified: Secondary | ICD-10-CM | POA: Insufficient documentation

## 2018-09-02 DIAGNOSIS — J45909 Unspecified asthma, uncomplicated: Secondary | ICD-10-CM | POA: Insufficient documentation

## 2018-09-02 DIAGNOSIS — F17210 Nicotine dependence, cigarettes, uncomplicated: Secondary | ICD-10-CM | POA: Insufficient documentation

## 2018-09-02 LAB — COMPREHENSIVE METABOLIC PANEL
ALK PHOS: 75 U/L (ref 38–126)
ALT: 32 U/L (ref 0–44)
ANION GAP: 10 (ref 5–15)
AST: 26 U/L (ref 15–41)
Albumin: 3.7 g/dL (ref 3.5–5.0)
BILIRUBIN TOTAL: 0.4 mg/dL (ref 0.3–1.2)
BUN: 6 mg/dL (ref 6–20)
CALCIUM: 9.2 mg/dL (ref 8.9–10.3)
CO2: 21 mmol/L — ABNORMAL LOW (ref 22–32)
Chloride: 106 mmol/L (ref 98–111)
Creatinine, Ser: 0.69 mg/dL (ref 0.44–1.00)
GFR calc Af Amer: >60 mL/min (ref >60)
Glucose, Bld: 129 mg/dL — ABNORMAL HIGH (ref 70–99)
Potassium: 3.5 mmol/L (ref 3.5–5.1)
Sodium: 137 mmol/L (ref 135–145)
TOTAL PROTEIN: 7.3 g/dL (ref 6.5–8.1)

## 2018-09-02 LAB — CBC
HEMATOCRIT: 38.6 % (ref 36.0–46.0)
Hemoglobin: 12.1 g/dL (ref 12.0–15.0)
MCH: 27.7 pg (ref 26.0–34.0)
MCHC: 31.3 g/dL (ref 30.0–36.0)
MCV: 88.3 fL (ref 80.0–100.0)
Platelets: 223 10*3/uL (ref 150–400)
RBC: 4.37 MIL/uL (ref 3.87–5.11)
RDW: 13.4 % (ref 11.5–15.5)
WBC: 12.3 10*3/uL — ABNORMAL HIGH (ref 4.0–10.5)
nRBC: 0 % (ref 0.0–0.2)

## 2018-09-02 LAB — I-STAT BETA HCG BLOOD, ED (MC, WL, AP ONLY)

## 2018-09-02 MED ORDER — IOHEXOL 300 MG/ML  SOLN
75.0000 mL | Freq: Once | INTRAMUSCULAR | Status: AC | PRN
Start: 2018-09-02 — End: 2018-09-02
  Administered 2018-09-02: 75 mL via INTRAVENOUS

## 2018-09-02 MED ORDER — OXYCODONE HCL 5 MG PO TABS
5.0000 mg | ORAL_TABLET | Freq: Once | ORAL | Status: AC
  Administered 2018-09-02: 5 mg via ORAL
  Filled 2018-09-02: qty 1

## 2018-09-02 MED ORDER — LIDOCAINE-EPINEPHRINE-TETRACAINE (LET) SOLUTION
3.0000 mL | Freq: Once | NASAL | Status: AC
  Administered 2018-09-02: 3 mL via TOPICAL
  Filled 2018-09-02: qty 3

## 2018-09-02 MED ORDER — AMOXICILLIN-POT CLAVULANATE 875-125 MG PO TABS: 1.0000 | ORAL_TABLET | Freq: Two times a day (BID) | ORAL | 0 refills | Status: AC

## 2018-09-02 NOTE — Discharge Instructions (Signed)
You were evaluated in the Emergency Department and after careful evaluation, we did not find any emergent condition requiring admission or further testing in the hospital.  Your symptoms today seem to be due to infection of the soft tissues of the face related to a tooth infection.  There is a small area of questionable abscess in the mouth so we attempted to drain.  Please switch her antibiotics to the ones provided here.  Take as directed.  It is importantly follow-up with the dentist.  Please return to the Emergency Department if you experience any worsening of your condition.  We encourage you to follow up with a primary care provider.  Thank you for allowing us to be a part of your care.

## 2018-09-02 NOTE — ED Provider Notes (Signed)
[facility] Emergency Department Provider Note MRN:  [id number]  Arrival date & time: 09/02/18     Chief Complaint   Dental Pain   History of Present Illness   Alexandra Moreno is a 27 y.o. year-old female with a history of asthma presenting to the ED with chief complaint of dental pain.  Patient was diagnosed with a tooth infection yesterday at another hospital.  Was prescribed penicillin.  Has been taking the penicillin as directed.  Woke up this morning with significant worsening of her swelling to the right side of her face.  Pain is worsening, not improved with home Tylenol.  Starting to have trouble swallowing, began to have trouble fully opening and closing her mouth.  Denies shortness of breath, no fever.  Review of Systems  A complete 10 system review of systems was obtained and all systems are negative except as noted in the HPI and PMH.   Patient's Health History    Past Medical History:  Diagnosis Date  . Anxiety   . Asthma   . Depression   . Ectopic pregnancy   . Emphysema lung (HCC)    Sees a pulmologist currently being tested for emphysema  . Manic-depressive (HCC)   . Mental disorder     Past Surgical History:  Procedure Laterality Date  . ADENOIDECTOMY AND MYRINGOTOMY WITH TUBE PLACEMENT    . DILATION AND CURETTAGE OF UTERUS    . LAPAROSCOPY FOR ECTOPIC PREGNANCY      Family History  Problem Relation Age of Onset  . Hypertension Father   . Heart disease Father   . Diabetes Paternal Grandfather     Social History   Socioeconomic History  . Marital status: Single    Spouse name: Not on file  . Number of children: Not on file  . Years of education: Not on file  . Highest education level: Not on file  Occupational History  . Not on file  Social Needs  . Financial resource strain: Not on file  . Food insecurity:    Worry: Not on file    Inability: Not on file  . Transportation needs:    Medical: Not on file    Non-medical: Not  on file  Tobacco Use  . Smoking status: Current Every Day Smoker    Packs/day: 1.00    Types: Cigarettes  . Smokeless tobacco: Never Used  Substance and Sexual Activity  . Alcohol use: No    Comment: social before pregnancy  . Drug use: No  . Sexual activity: Yes  Lifestyle  . Physical activity:    Days per week: Not on file    Minutes per session: Not on file  . Stress: Not on file  Relationships  . Social connections:    Talks on phone: Not on file    Gets together: Not on file    Attends religious service: Not on file    Active member of club or organization: Not on file    Attends meetings of clubs or organizations: Not on file    Relationship status: Not on file  . Intimate partner violence:    Fear of current or ex partner: Not on file    Emotionally abused: Not on file    Physically abused: Not on file    Forced sexual activity: Not on file  Other Topics Concern  . Not on file  Social History Narrative  . Not on file     Physical Exam  Vital  Signs and Nursing Notes reviewed Vitals:   09/02/18 2008 09/02/18 2315  BP: 128/80 107/63  Pulse: 98 94  Resp: 18 18  Temp: 98.4 F (36.9 C)   SpO2: 97% 98%    CONSTITUTIONAL: Well-appearing, NAD NEURO:  Alert and oriented x 3, no focal deficits EYES:  eyes equal and reactive ENT/NECK:  no LAD, no JVD; poor dentition, fluctuance behind the right mandibular molars CARDIO: Regular rate, well-perfused, normal S1 and S2 PULM:  CTAB no wheezing or rhonchi GI/GU:  normal bowel sounds, non-distended, non-tender MSK/SPINE:  No gross deformities, no edema SKIN:  no rash, atraumatic PSYCH:  Appropriate speech and behavior  Diagnostic and Interventional Summary    Labs Reviewed  CBC - Abnormal; Notable for the following components:      Result Value   WBC 12.3 (*)    All other components within normal limits  COMPREHENSIVE METABOLIC PANEL - Abnormal; Notable for the following components:   CO2 21 (*)    Glucose, Bld  129 (*)    All other components within normal limits  I-STAT BETA HCG BLOOD, ED ([facility], [facility], [facility] ONLY)    CT Maxillofacial W Contrast  Final Result      Medications  oxyCODONE (Oxy IR/ROXICODONE) immediate release tablet 5 mg (5 mg Oral Given 09/02/18 2114)  iohexol (OMNIPAQUE) 300 MG/ML solution 75 mL (75 mLs Intravenous Contrast Given 09/02/18 2208)  lidocaine-EPINEPHrine-tetracaine (LET) solution (3 mLs Topical Given 09/02/18 2314)     .Marland KitchenIncision and Drainage Date/Time: 09/02/2018 11:42 PM Performed by: Sabas Sous, MD Authorized by: Sabas Sous, MD   Consent:    Consent obtained:  Verbal   Consent given by:  Patient Location:    Type:  Abscess   Size:  1cm   Location:  Mouth   Mouth location:  Alveolar process Anesthesia (see MAR for exact dosages):    Anesthesia method:  Topical application   Topical anesthetic:  LET Procedure type:    Complexity:  Simple Procedure details:    Incision types:  Stab incision   Incision depth:  Submucosal   Scalpel blade:  11   Drainage:  Bloody   Drainage amount:  Scant Post-procedure details:    Patient tolerance of procedure:  Tolerated well, no immediate complications    Critical Care  ED Course and Medical Decision Making  I have reviewed the triage vital signs and the nursing notes.  Pertinent labs & imaging results that were available during my care of the patient were reviewed by me and considered in my medical decision making (see below for details).  Favoring dental abscess in this 27 year old female with poor dentition.  However given the recent concern for trismus, trouble swallowing, swelling there is the possibility of deeper space infection requiring evaluation.  Labs, CT, will reassess.  CT reveals odontogenic cellulitis, no significant abscess for drainage.  Question of inflammation of the right masseter muscle.  Still small area of fluctuance on exam, I&D attempted with minimal expression of fluid.  Advised  to stop taking penicillin, given prescription for Augmentin, will follow-up with dentistry.  Elmer Sow. Pilar Plate, MD [facility] Emergency Medicine [facility] [email or web address]  Final Clinical Impressions(s) / ED Diagnoses     ICD-10-CM   1. Cellulitis of face L03.211   2. Infected dental caries K02.9    K04.7     ED Discharge Orders         Ordered    amoxicillin-clavulanate (AUGMENTIN) 875-125 MG tablet  2  times daily     09/02/18 2339             Sabas SousBero, Jaysiah Marchetta M, MD 09/02/18 [phone]

## 2018-09-02 NOTE — ED Triage Notes (Signed)
Pt arrives via POV for eval of R sided mouth swelling. [facility], given script for PNS and meloxicam. PT reports she awoke this morning w/ worsening mouth swelling and difficulty swallowing. Pt denies SOB, able to manage own secretions. Pt reports she has taken a total of 4 doses of PNS w/ no improvement. Reports sx feel worse. Pt in NAD on arrival,

## 2019-07-20 ENCOUNTER — Ambulatory Visit: Payer: Self-pay

## 2019-07-20 ENCOUNTER — Other Ambulatory Visit: Payer: Self-pay | Admitting: Occupational Medicine

## 2019-07-20 ENCOUNTER — Other Ambulatory Visit: Payer: Self-pay

## 2019-07-20 DIAGNOSIS — M79675 Pain in left toe(s): Secondary | ICD-10-CM

## 2020-04-20 HISTORY — PX: TUBAL LIGATION: SHX77

## 2020-11-02 DIAGNOSIS — K8010 Calculus of gallbladder with chronic cholecystitis without obstruction: Secondary | ICD-10-CM | POA: Insufficient documentation

## 2020-11-02 HISTORY — DX: Calculus of gallbladder with chronic cholecystitis without obstruction: K80.10

## 2020-12-27 ENCOUNTER — Ambulatory Visit (INDEPENDENT_AMBULATORY_CARE_PROVIDER_SITE_OTHER): Payer: BC Managed Care – PPO | Admitting: Plastic Surgery

## 2020-12-27 ENCOUNTER — Other Ambulatory Visit: Payer: Self-pay

## 2020-12-27 ENCOUNTER — Encounter: Payer: Self-pay | Admitting: Plastic Surgery

## 2020-12-27 VITALS — BP 117/77 | HR 72 | Ht 66.0 in | Wt 212.4 lb

## 2020-12-27 DIAGNOSIS — N62 Hypertrophy of breast: Secondary | ICD-10-CM

## 2020-12-27 DIAGNOSIS — M545 Low back pain, unspecified: Secondary | ICD-10-CM

## 2020-12-27 DIAGNOSIS — M793 Panniculitis, unspecified: Secondary | ICD-10-CM

## 2020-12-27 DIAGNOSIS — M4004 Postural kyphosis, thoracic region: Secondary | ICD-10-CM | POA: Diagnosis not present

## 2020-12-27 DIAGNOSIS — M546 Pain in thoracic spine: Secondary | ICD-10-CM

## 2020-12-27 NOTE — Progress Notes (Addendum)
Referring Provider No referring provider defined for this encounter.   CC:  Chief Complaint  Patient presents with  . Advice Only      Alexandra Moreno is an 30 y.o. female.  HPI: Patient presents to discuss her abdomen.  She has lost weight through diet and exercise and has lost over 50 pounds.  Her weights been stable for the last 3 to 6 months.  She gets intermittent rashes beneath her abdominal skin and has chronic irritation.  She is interested in abdominal contouring to treat these skin conditions.  They have been refractory to over-the-counter and prescription treatments.  Her only previous abdominal operations are a laparoscopic tubal ligation.  Additionally the patient is bothered by her large breasts.  She gets back pain and has been to a chiropractor with little relief.  She has tried over-the-counter medications, warm packs, cold packs and supportive bras with little relief.  She is currently larger than a double D and would like to be a C or D cup.  She has had pain focused in her neck and shoulders as well.  She gets rashes beneath her breast that she is treated with over-the-counter treatments with limited and temporary relief.  She is interested in a breast reduction.  She is not a diabetic and quit smoking over a year ago.  She has not had any previous breast procedures or biopsies.  Allergies  Allergen Reactions  . Tylenol [Acetaminophen] Other (See Comments)    "Jittery, lethargic"    Outpatient Encounter Medications as of 12/27/2020  Medication Sig  . ibuprofen (ADVIL,MOTRIN) 600 MG tablet Take 1 tablet (600 mg total) by mouth every 6 (six) hours.  . Omega-3 Fatty Acids (FISH OIL PO) Take by mouth.  Marland Kitchen OZEMPIC, 1 MG/DOSE, 4 MG/3ML SOPN As directed.  . Prenatal Vit-Fe Fumarate-FA (PRENATAL MULTIVITAMIN) TABS tablet Take 1 tablet by mouth daily at 12 noon.   No facility-administered encounter medications on file as of 12/27/2020.     Past Medical History:  Diagnosis Date   . Anxiety   . Asthma   . Depression   . Ectopic pregnancy   . Emphysema lung (HCC)    Sees a pulmologist currently being tested for emphysema  . Manic-depressive (HCC)   . Mental disorder     Past Surgical History:  Procedure Laterality Date  . ADENOIDECTOMY AND MYRINGOTOMY WITH TUBE PLACEMENT    . DILATION AND CURETTAGE OF UTERUS    . LAPAROSCOPY FOR ECTOPIC PREGNANCY      Family History  Problem Relation Age of Onset  . Hypertension Father   . Heart disease Father   . Diabetes Paternal Grandfather     Social History   Social History Narrative  . Not on file     Review of Systems General: Denies fevers, chills, weight loss CV: Denies chest pain, shortness of breath, palpitations  Physical Exam Vitals with BMI 12/27/2020 09/02/2018 09/02/2018  Height 5\' 6"  - -  Weight 212 lbs 6 oz - -  BMI 34.3 - -  Systolic 117 107  Diastolic 77 63 80  Pulse 72 94 98    General:  No acute distress,  Alert and oriented, Non-Toxic, Normal speech and affect Breast: She has grade 3 ptosis.  Sternal notch to nipple is 32 cm bilaterally.  Nipple to fold is 13 cm 14 cm on the left.  I do not see any obvious scars or masses. Abdomen: Abdomen is soft nontender.  She has an  overhanging pannus.  She has a small periumbilical scar from the tubal ligation.  There is signs of chronic inflammation in the infra pannus crease.  I do not appreciate any hernias.  She does seem to have rectus diastases to a moderate degree.  Assessment/Plan The patient has bilateral symptomatic macromastia.  She is a good candidate for a breast reduction.  She is interested in pursuing surgical treatment.  She has tried supportive garments and fitted bras with no relief.  The details of breast reduction surgery were discussed.  I explained the procedure in detail along the with the expected scars.  The risks were discussed in detail and include bleeding, infection, damage to surrounding structures, need for  additional procedures, nipple loss, change in nipple sensation, persistent pain, contour irregularities and asymmetries.  I explained that breast feeding is often not possible after breast reduction surgery.  We discussed the expected postoperative course with an overall recovery period of about 1 month.  She demonstrated full understanding of all risks.  We discussed her personal risk factors.  I anticipate approximately 775g of tissue removed from each side.  For the abdomen I do think that she would qualify for a panniculectomy given her symptoms.  She is interested in adding some upper abdominal contouring with liposuction rectus plication to improve her overall aesthetic result.  We discussed that procedure with her in detailing along with the risk that include bleeding, infection, damage to surrounding structures and need for additional procedures.  We discussed what to expect postoperatively along with the fact that drains would be utilized.  We will plan to submit the breast and lower abdomen to insurance with the understanding that she will likely have a Pap additional cosmetic fee for the upper abdomen and liposuction.  All her questions were answered we will plan to move forward.   Alexandra Moreno 12/27/2020, 10:39 AM

## 2021-04-18 ENCOUNTER — Emergency Department (HOSPITAL_COMMUNITY)
Admission: EM | Admit: 2021-04-18 | Discharge: 2021-04-18 | Discharge status: Against Medical Advice | Payer: BC Managed Care – PPO

## 2021-04-18 NOTE — ED Notes (Signed)
Called for triage vitalsx3

## 2021-04-18 NOTE — ED Notes (Signed)
Called for triage x 1 with no respond.

## 2021-05-13 ENCOUNTER — Emergency Department (HOSPITAL_COMMUNITY)
Admission: EM | Admit: 2021-05-13 | Discharge: 2021-05-13 | Disposition: A | Discharge status: Home / Self Care | Payer: BC Managed Care – PPO | Attending: Emergency Medicine | Admitting: Emergency Medicine

## 2021-05-13 ENCOUNTER — Emergency Department (HOSPITAL_COMMUNITY): Payer: BC Managed Care – PPO

## 2021-05-13 ENCOUNTER — Encounter (HOSPITAL_COMMUNITY): Payer: Self-pay | Admitting: Emergency Medicine

## 2021-05-13 DIAGNOSIS — J479 Bronchiectasis, uncomplicated: Secondary | ICD-10-CM | POA: Diagnosis not present

## 2021-05-13 DIAGNOSIS — Z87891 Personal history of nicotine dependence: Secondary | ICD-10-CM | POA: Diagnosis not present

## 2021-05-13 DIAGNOSIS — J45909 Unspecified asthma, uncomplicated: Secondary | ICD-10-CM | POA: Insufficient documentation

## 2021-05-13 DIAGNOSIS — R0789 Other chest pain: Secondary | ICD-10-CM

## 2021-05-13 LAB — CBC WITH DIFFERENTIAL/PLATELET
Abs Immature Granulocytes: 0.01 10*3/uL (ref 0.00–0.07)
Basophils Absolute: 0 10*3/uL (ref 0.0–0.1)
Basophils Relative: 0 %
Eosinophils Absolute: 0 10*3/uL (ref 0.0–0.5)
Eosinophils Relative: 0 %
HCT: 34.5 % — ABNORMAL LOW (ref 36.0–46.0)
Hemoglobin: 11.3 g/dL — ABNORMAL LOW (ref 12.0–15.0)
Immature Granulocytes: 0 %
Lymphocytes Relative: 33 %
Lymphs Abs: 1.8 10*3/uL (ref 0.7–4.0)
MCH: 28.4 pg (ref 26.0–34.0)
MCHC: 32.8 g/dL (ref 30.0–36.0)
MCV: 86.7 fL (ref 80.0–100.0)
Monocytes Absolute: 0.3 10*3/uL (ref 0.1–1.0)
Monocytes Relative: 5 %
Neutro Abs: 3.3 10*3/uL (ref 1.7–7.7)
Neutrophils Relative %: 62 %
Platelets: 208 10*3/uL (ref 150–400)
RBC: 3.98 MIL/uL (ref 3.87–5.11)
RDW: 13.3 % (ref 11.5–15.5)
WBC: 5.4 10*3/uL (ref 4.0–10.5)
nRBC: 0 % (ref 0.0–0.2)

## 2021-05-13 LAB — BASIC METABOLIC PANEL
Anion gap: 7 (ref 5–15)
BUN: 9 mg/dL (ref 6–20)
CO2: 23 mmol/L (ref 22–32)
Calcium: 9.2 mg/dL (ref 8.9–10.3)
Chloride: 108 mmol/L (ref 98–111)
Creatinine, Ser: 0.81 mg/dL (ref 0.44–1.00)
GFR, Estimated: >60 mL/min (ref >60)
Glucose, Bld: 115 mg/dL — ABNORMAL HIGH (ref 70–99)
Potassium: 3.4 mmol/L — ABNORMAL LOW (ref 3.5–5.1)
Sodium: 138 mmol/L (ref 135–145)

## 2021-05-13 LAB — I-STAT BETA HCG BLOOD, ED (MC, WL, AP ONLY): I-stat hCG, quantitative: <5 m[IU]/mL (ref <5)

## 2021-05-13 MED ORDER — KETOROLAC TROMETHAMINE 30 MG/ML IJ SOLN
30.0000 mg | Freq: Once | INTRAMUSCULAR | Status: AC
  Administered 2021-05-13: 30 mg via INTRAVENOUS
  Filled 2021-05-13: qty 1

## 2021-05-13 MED ORDER — PREDNISONE 10 MG (21) PO TBPK: ORAL_TABLET | Freq: Every day | ORAL | 0 refills | Status: DC

## 2021-05-13 MED ORDER — PREDNISONE 20 MG PO TABS
60.0000 mg | ORAL_TABLET | Freq: Once | ORAL | Status: AC
  Administered 2021-05-13: 60 mg via ORAL
  Filled 2021-05-13: qty 3

## 2021-05-13 MED ORDER — IOHEXOL 350 MG/ML SOLN
75.0000 mL | Freq: Once | INTRAVENOUS | Status: AC | PRN
  Administered 2021-05-13: 75 mL via INTRAVENOUS

## 2021-05-13 NOTE — ED Triage Notes (Signed)
Pt reports intermittent "L lung pain" x 10 years.  States she has been seen by pulmonologist over the past 10 yrs for same.  States she has been told she has a lung mass and needs a biopsy but scared of risk.  Pt tearful.Marland Kitchen

## 2021-05-13 NOTE — ED Provider Notes (Signed)
[facility] EMERGENCY DEPARTMENT Provider Note   CSN: [id number] Arrival date & time: 05/13/21  1714     History No chief complaint on file.   Alexandra Moreno is a 30 y.o. female.  Pt presents to the ED today with left sided cp.  Pt said sx have been intermittent for 10 years.  She has been told that she has many different things.  She said her left sided cp started yesterday.  She said it feels very sharp.  It hurts when her lungs move.  Sitting up helps.      Past Medical History:  Diagnosis Date   Anxiety    Asthma    Depression    Ectopic pregnancy    Emphysema lung (HCC)    Sees a pulmologist currently being tested for emphysema   Manic-depressive (HCC)    Mental disorder     Patient Active Problem List   Diagnosis Date Noted   Normal labor and delivery 12/16/2014   Late prenatal care affecting pregnancy in third trimester, antepartum 11/30/2014   Evaluate anatomy not seen on prior sonogram     Past Surgical History:  Procedure Laterality Date   ADENOIDECTOMY AND MYRINGOTOMY WITH TUBE PLACEMENT     DILATION AND CURETTAGE OF UTERUS     LAPAROSCOPY FOR ECTOPIC PREGNANCY       OB History     Gravida  5   Para  3   Term  3   Preterm  0   AB  2   Living  3      SAB      IAB      Ectopic  1   Multiple  0   Live Births  3           Family History  Problem Relation Age of Onset   Hypertension Father    Heart disease Father    Diabetes Paternal Grandfather     Social History   Tobacco Use   Smoking status: Former    Packs/day: 1.00    Types: Cigarettes    Quit date: 12/28/2018    Years since quitting: 2.3   Smokeless tobacco: Never  Substance Use Topics   Alcohol use: No    Comment: social before pregnancy   Drug use: No    Home Medications Prior to Admission medications   Medication Sig Start Date End Date Taking? Authorizing Provider  predniSONE (STERAPRED UNI-PAK 21 TAB) 10 MG (21) TBPK tablet Take by  mouth daily. Take 6 tabs by mouth daily  for 2 days, then 5 tabs for 2 days, then 4 tabs for 2 days, then 3 tabs for 2 days, 2 tabs for 2 days, then 1 tab by mouth daily for 2 days 05/13/21  Yes Jacalyn Lefevre, MD  ibuprofen (ADVIL,MOTRIN) 600 MG tablet Take 1 tablet (600 mg total) by mouth every 6 (six) hours. 12/18/14   Abram Sander, MD  Omega-3 Fatty Acids (FISH OIL PO) Take by mouth.    [provider]  OZEMPIC, 1 MG/DOSE, 4 MG/3ML SOPN As directed. 12/19/20   [provider]  Prenatal Vit-Fe Fumarate-FA (PRENATAL MULTIVITAMIN) TABS tablet Take 1 tablet by mouth daily at 12 noon.    [provider]    Allergies    Tylenol [acetaminophen]  Review of Systems   Review of Systems  Cardiovascular:  Positive for chest pain.  All other systems reviewed and are negative.  Physical Exam Updated Vital Signs BP  115/70   Pulse 64   Temp 98.8 F (37.1 C) (Oral)   Resp 19   SpO2 99%   Physical Exam Vitals and nursing note reviewed.  Constitutional:      Appearance: Normal appearance.  HENT:     Head: Normocephalic and atraumatic.     Right Ear: External ear normal.     Left Ear: External ear normal.     Nose: Nose normal.     Mouth/Throat:     Mouth: Mucous membranes are moist.     Pharynx: Oropharynx is clear.  Eyes:     Extraocular Movements: Extraocular movements intact.     Conjunctiva/sclera: Conjunctivae normal.     Pupils: Pupils are equal, round, and reactive to light.  Cardiovascular:     Rate and Rhythm: Normal rate and regular rhythm.     Pulses: Normal pulses.     Heart sounds: Normal heart sounds.  Pulmonary:     Effort: Pulmonary effort is normal.     Breath sounds: Normal breath sounds.  Abdominal:     General: Abdomen is flat. Bowel sounds are normal.     Palpations: Abdomen is soft.  Musculoskeletal:        General: Normal range of motion.     Cervical back: Normal range of motion and neck supple.  Skin:    General: Skin is warm.      Capillary Refill: Capillary refill takes less than 2 seconds.  Neurological:     General: No focal deficit present.     Mental Status: She is alert and oriented to person, place, and time.  Psychiatric:        Mood and Affect: Mood normal.        Behavior: Behavior normal.        Thought Content: Thought content normal.        Judgment: Judgment normal.    ED Results / Procedures / Treatments   Labs (all labs ordered are listed, but only abnormal results are displayed) Labs Reviewed  BASIC METABOLIC PANEL - Abnormal; Notable for the following components:      Result Value   Potassium 3.4 (*)    Glucose, Bld 115 (*)    All other components within normal limits  CBC WITH DIFFERENTIAL/PLATELET - Abnormal; Notable for the following components:   Hemoglobin 11.3 (*)    HCT 34.5 (*)    All other components within normal limits  I-STAT BETA HCG BLOOD, ED ([facility], [facility], [facility] ONLY)    EKG EKG Interpretation  Date/Time:  Sunday May 13 2021 17:14:21 EDT Ventricular Rate:  78 PR Interval:  158 QRS Duration: 80 QT Interval:  378 QTC Calculation: 430 R Axis:   78 Text Interpretation: Normal sinus rhythm with sinus arrhythmia Normal ECG No old tracing to compare Confirmed by Jacalyn Lefevre [phone]) on 05/13/2021 5:49:56 PM  Radiology DG Chest 2 View  Result Date: 05/13/2021 CLINICAL DATA:  Shortness of breath and chest pain. EXAM: CHEST - 2 VIEW COMPARISON:  04/19/2021 FINDINGS: The heart size and mediastinal contours are within normal limits. Both lungs are clear. The visualized skeletal structures are unremarkable. IMPRESSION: No active cardiopulmonary disease. Electronically Signed   By: Burman Nieves M.D.   On: 05/13/2021 18:57   CT Angio Chest PE W and/or Wo Contrast  Result Date: 05/13/2021 CLINICAL DATA:  Pt reports intermittent "L lung pain" x 10 years. Pt states SOB at times EXAM: CT ANGIOGRAPHY CHEST WITH CONTRAST TECHNIQUE: Multidetector CT imaging of the  chest was performed  using the standard protocol during bolus administration of intravenous contrast. Multiplanar CT image reconstructions and MIPs were obtained to evaluate the vascular anatomy. CONTRAST:  39mL OMNIPAQUE IOHEXOL 350 MG/ML SOLN COMPARISON:  03/02/2014. FINDINGS: Cardiovascular: Pulmonary arteries are well opacified. No evidence of a pulmonary embolism. Normal heart. No pericardial effusion. Great vessels are normal in caliber. Aorta is not opacified. Mediastinum/Nodes: No enlarged mediastinal, hilar, or axillary lymph nodes. Thyroid gland, trachea, and esophagus demonstrate no significant findings. Lungs/Pleura: Small area of cystic change, likely focal bronchiectasis, in the posteromedial left lower lobe. This is the location of a pneumonia noted on the prior CT. Lungs otherwise clear. No pleural effusion or pneumothorax. Upper Abdomen: Gallstones. No acute findings. No other abnormalities in the visualized upper abdomen. Musculoskeletal: No chest wall abnormality. No acute or significant osseous findings. Review of the MIP images confirms the above findings. IMPRESSION: 1. No evidence of a pulmonary embolism. 2. No acute findings. 3. Gallstones. Electronically Signed   By: Amie Portland M.D.   On: 05/13/2021 19:59    Procedures Procedures   Medications Ordered in ED Medications  predniSONE (DELTASONE) tablet 60 mg (has no administration in time range)  ketorolac (TORADOL) 30 MG/ML injection 30 mg (30 mg Intravenous Given 05/13/21 1755)  iohexol (OMNIPAQUE) 350 MG/ML injection 75 mL (75 mLs Intravenous Contrast Given 05/13/21 1950)    ED Course  I have reviewed the triage vital signs and the nursing notes.  Pertinent labs & imaging results that were available during my care of the patient were reviewed by me and considered in my medical decision making (see chart for details).    MDM Rules/Calculators/A&P                           Pt with some focal bronchiectasis from prior pna.  Nothing acute.  No  pe.  Pt may have some pleurisy as cp is sharp.  Pt is given a referral to pulm.  Return if worse.  Final Clinical Impression(s) / ED Diagnoses Final diagnoses:  Atypical chest pain  Bronchiectasis without acute exacerbation [facility])    Rx / DC Orders ED Discharge Orders          Ordered    Ambulatory referral to Pulmonology        05/13/21 2010    predniSONE (STERAPRED UNI-PAK 21 TAB) 10 MG (21) TBPK tablet  Daily        05/13/21 2011             Jacalyn Lefevre, MD 05/13/21 2014

## 2021-05-13 NOTE — ED Provider Notes (Signed)
Emergency Medicine Provider Triage Evaluation Note  Alexandra Moreno , a 30 y.o. female  was evaluated in triage.  Pt complains of left lung pain, x 10 years, seems to flare at times, this time started yesterday. Told in the past she had PNA, pulmonary fibrosis, pulmonary neuropathy, lower left lung mass. Has had MRI and other imaging at [place].   Review of Systems  Positive: Lung pain Negative: fever  Physical Exam  BP 131/77   Pulse 76   Temp 98.8 F (37.1 C) (Oral)   Resp (!) 24   SpO2 99%  Gen:   Awake, no distress, tearful   Resp:  Normal effort  MSK:   Moves extremities without difficulty  Other:  Lungs CTA  Medical Decision Making  Medically screening exam initiated at 5:19 PM.  Appropriate orders placed.  Mariana Arn was informed that the remainder of the evaluation will be completed by another provider, this initial triage assessment does not replace that evaluation, and the importance of remaining in the ED until their evaluation is complete.     Jeannie Fend, PA-C 05/13/21 1727    Cheryll Cockayne, MD 05/15/21 [phone]

## 2021-06-21 HISTORY — PX: RADICAL HYSTERECTOMY: SHX2283

## 2021-06-27 ENCOUNTER — Ambulatory Visit (INDEPENDENT_AMBULATORY_CARE_PROVIDER_SITE_OTHER): Payer: BC Managed Care – PPO | Admitting: Emergency Medicine

## 2021-06-27 ENCOUNTER — Other Ambulatory Visit: Payer: Self-pay

## 2021-06-27 ENCOUNTER — Encounter: Payer: Self-pay | Admitting: Emergency Medicine

## 2021-06-27 VITALS — BP 122/80 | HR 78 | Temp 98.3°F | Ht 65.5 in | Wt 215.2 lb

## 2021-06-27 DIAGNOSIS — R0602 Shortness of breath: Secondary | ICD-10-CM | POA: Diagnosis not present

## 2021-06-27 DIAGNOSIS — R0781 Pleurodynia: Secondary | ICD-10-CM

## 2021-06-27 DIAGNOSIS — R9389 Abnormal findings on diagnostic imaging of other specified body structures: Secondary | ICD-10-CM | POA: Diagnosis not present

## 2021-06-27 DIAGNOSIS — J45909 Unspecified asthma, uncomplicated: Secondary | ICD-10-CM

## 2021-06-27 NOTE — Patient Instructions (Signed)
We will perform pulmonary function testing at your next office visit Keep albuterol available to use 2 puffs if needed for shortness of breath, chest tightness, wheezing. We will perform lab work today. Follow with Dr. Delton Coombes next available with full pulmonary function testing on the same day.

## 2021-06-27 NOTE — Progress Notes (Signed)
Subjective:    Patient ID: Alexandra Moreno, female    DOB: 29-Sep-1991, 30 y.o.   MRN: [id number]  HPI 30 yo woman, former tobacco, hx bipolar d/o, FM, carries a history of asthma that was made around age 42, has been treated w inhaled meds (has been on Spiriva and Advair before). Uses albuterol prn, often w exercise.  She has had intermittent L chest and flank pain for years. No real identifiable trigger. Sometimes w bending. No accompanying rash or constitutional sx to suggest lupus.    CT chest 05/13/2021 reviewed by me, shows no enlarged mediastinal or hilar lymphadenopathy, small focal area of cystic change in the posterior medial left lower lobe and an area where she previously had pneumonia 2006  Review of Systems As per HPI   Past Medical History:  Diagnosis Date   Anxiety    Asthma    Depression    Ectopic pregnancy    Emphysema lung (HCC)    Sees a pulmologist currently being tested for emphysema   Manic-depressive (HCC)    Mental disorder      Family History  Problem Relation Age of Onset   Hypertension Father    Heart disease Father    Diabetes Paternal Grandfather      Social History   Socioeconomic History   Marital status: Single    Spouse name: Not on file   Number of children: Not on file   Years of education: Not on file   Highest education level: Not on file  Occupational History   Not on file  Tobacco Use   Smoking status: Former    Packs/day: 1.00    Types: Cigarettes    Quit date: 12/28/2018    Years since quitting: 2.5   Smokeless tobacco: Never  Substance and Sexual Activity   Alcohol use: No    Comment: social before pregnancy   Drug use: No   Sexual activity: Yes  Other Topics Concern   Not on file  Social History Narrative   Not on file   Social Determinants of Health   Financial Resource Strain: Not on file  Food Insecurity: Not on file  Transportation Needs: Not on file  Physical Activity: Not on file  Stress: Not on file   Social Connections: Not on file  Intimate Partner Violence: Not on file     Allergies  Allergen Reactions   Tylenol [Acetaminophen] Other (See Comments)    "Jittery, lethargic"     Outpatient Medications Prior to Visit  Medication Sig Dispense Refill   ibuprofen (ADVIL,MOTRIN) 600 MG tablet Take 1 tablet (600 mg total) by mouth every 6 (six) hours. 30 tablet 0   Omega-3 Fatty Acids (FISH OIL PO) Take by mouth. (Patient not taking: Reported on 06/27/2021)     OZEMPIC, 1 MG/DOSE, 4 MG/3ML SOPN As directed. (Patient not taking: Reported on 06/27/2021)     predniSONE (STERAPRED UNI-PAK 21 TAB) 10 MG (21) TBPK tablet Take by mouth daily. Take 6 tabs by mouth daily  for 2 days, then 5 tabs for 2 days, then 4 tabs for 2 days, then 3 tabs for 2 days, 2 tabs for 2 days, then 1 tab by mouth daily for 2 days (Patient not taking: Reported on 06/27/2021) 42 tablet 0   Prenatal Vit-Fe Fumarate-FA (PRENATAL MULTIVITAMIN) TABS tablet Take 1 tablet by mouth daily at 12 noon. (Patient not taking: Reported on 06/27/2021)     No facility-administered medications prior to visit.  Objective:   Physical Exam Vitals:   06/27/21 1148  BP: 122/80  Pulse: 78  Temp: 98.3 F (36.8 C)  TempSrc: Oral  SpO2: 99%  Weight: 215 lb 3.2 oz (97.6 kg)  Height: 5' 5.5" (1.664 m)   Gen: Pleasant, overwt woman, in no distress,  pressured affect  ENT: No lesions,  mouth clear,  oropharynx clear, no postnasal drip  Neck: No JVD, no stridor  Lungs: No use of accessory muscles, no crackles or wheezing on normal respiration, no wheeze on forced expiration  Cardiovascular: RRR, heart sounds normal, no murmur or gallops, no peripheral edema  Musculoskeletal: No deformities, no cyanosis or clubbing  Neuro: alert, awake, non focal  Skin: Warm, no lesions or rash      Assessment & Plan:   Abnormal CT of the chest Reviewed all of her past imaging.  CT 05/13/2021 with an area of cystic change in the posterior  medial left lower lobe, consistent with scar or focal bronchiectasis.  There is no widespread bronchiectatic change.  This focus is in the location of her previous medial right lower lobe pneumonia, almost certainly sequela of that process.  No reason to believe that this area is contributing to her left chest or flank pain.  It could put her at risk for retained secretions, cough, difficulty clearing secretions.  The chronic cough in turn could cause musculoskeletal chest discomfort.  Chest pain This is her biggest complaint, associated with cough.  There may be a pleural component but it seems to be mostly musculoskeletal, can be released partially replicated with palpation.  Suspect that with her frequent coughing she has musculoskeletal irritation and involvement.  No accompanying systemic symptoms to suggest lupus or other source of pleuritic pain.  I will send screening autoimmune labs.  Dyspnea With questionable history of asthma, no spirometry available.  This could be reflected in her cough.  We will perform pulmonary function testing to try to clarify any lower airways obstruction.  Okay for her to continue albuterol for now  Time spent 62 minutes  Levy Pupa, MD, PhD 07/04/2021, 3:26 PM [facility] [phone] or if no answer before 7:00PM call [phone] For any issues after 7:00PM please call eLink [phone]

## 2021-06-29 LAB — RHEUMATOID FACTOR: Rheumatoid fact SerPl-aCnc: <14 IU/mL (ref <14)

## 2021-06-29 LAB — SJOGREN'S SYNDROME ANTIBODS(SSA + SSB)
SSA (Ro) (ENA) Antibody, IgG: <1 AI
SSB (La) (ENA) Antibody, IgG: <1 AI

## 2021-06-29 LAB — ANA: Anti Nuclear Antibody (ANA): NEGATIVE

## 2021-06-29 LAB — ANTI-DNA ANTIBODY, DOUBLE-STRANDED: ds DNA Ab: 1 IU/mL

## 2021-06-29 LAB — CYCLIC CITRUL PEPTIDE ANTIBODY, IGG: Cyclic Citrullin Peptide Ab: <16 UNITS

## 2021-07-04 DIAGNOSIS — R079 Chest pain, unspecified: Secondary | ICD-10-CM | POA: Insufficient documentation

## 2021-07-04 DIAGNOSIS — R0600 Dyspnea, unspecified: Secondary | ICD-10-CM | POA: Insufficient documentation

## 2021-07-04 DIAGNOSIS — R9389 Abnormal findings on diagnostic imaging of other specified body structures: Secondary | ICD-10-CM | POA: Insufficient documentation

## 2021-07-04 HISTORY — DX: Dyspnea, unspecified: R06.00

## 2021-07-04 HISTORY — DX: Chest pain, unspecified: R07.9

## 2021-07-04 HISTORY — DX: Abnormal findings on diagnostic imaging of other specified body structures: R93.89

## 2021-07-04 NOTE — Assessment & Plan Note (Signed)
With questionable history of asthma, no spirometry available.  This could be reflected in her cough.  We will perform pulmonary function testing to try to clarify any lower airways obstruction.  Okay for her to continue albuterol for now

## 2021-07-04 NOTE — Assessment & Plan Note (Signed)
Reviewed all of her past imaging.  CT 05/13/2021 with an area of cystic change in the posterior medial left lower lobe, consistent with scar or focal bronchiectasis.  There is no widespread bronchiectatic change.  This focus is in the location of her previous medial right lower lobe pneumonia, almost certainly sequela of that process.  No reason to believe that this area is contributing to her left chest or flank pain.  It could put her at risk for retained secretions, cough, difficulty clearing secretions.  The chronic cough in turn could cause musculoskeletal chest discomfort.

## 2021-07-04 NOTE — Assessment & Plan Note (Addendum)
This is her biggest complaint, associated with cough.  There may be a pleural component but it seems to be mostly musculoskeletal, can be released partially replicated with palpation.  Suspect that with her frequent coughing she has musculoskeletal irritation and involvement.  No accompanying systemic symptoms to suggest lupus or other source of pleuritic pain.  I will send screening autoimmune labs.

## 2021-07-31 ENCOUNTER — Ambulatory Visit: Payer: BC Managed Care – PPO | Admitting: Emergency Medicine

## 2021-12-11 HISTORY — PX: CHOLECYSTECTOMY: SHX55

## 2022-03-20 ENCOUNTER — Other Ambulatory Visit: Payer: Self-pay | Admitting: Family Medicine

## 2022-03-20 DIAGNOSIS — R221 Localized swelling, mass and lump, neck: Secondary | ICD-10-CM

## 2022-04-28 ENCOUNTER — Emergency Department (HOSPITAL_BASED_OUTPATIENT_CLINIC_OR_DEPARTMENT_OTHER): Payer: BC Managed Care – PPO

## 2022-04-28 ENCOUNTER — Emergency Department (HOSPITAL_BASED_OUTPATIENT_CLINIC_OR_DEPARTMENT_OTHER)
Admission: EM | Admit: 2022-04-28 | Discharge: 2022-04-29 | Disposition: A | Discharge status: Home / Self Care | Payer: BC Managed Care – PPO | Attending: Emergency Medicine | Admitting: Emergency Medicine

## 2022-04-28 ENCOUNTER — Encounter (HOSPITAL_BASED_OUTPATIENT_CLINIC_OR_DEPARTMENT_OTHER): Payer: Self-pay

## 2022-04-28 ENCOUNTER — Other Ambulatory Visit: Payer: Self-pay

## 2022-04-28 DIAGNOSIS — R1013 Epigastric pain: Secondary | ICD-10-CM | POA: Insufficient documentation

## 2022-04-28 DIAGNOSIS — J45909 Unspecified asthma, uncomplicated: Secondary | ICD-10-CM | POA: Diagnosis not present

## 2022-04-28 DIAGNOSIS — R112 Nausea with vomiting, unspecified: Secondary | ICD-10-CM | POA: Insufficient documentation

## 2022-04-28 LAB — COMPREHENSIVE METABOLIC PANEL
ALT: 12 U/L (ref 0–44)
AST: 15 U/L (ref 15–41)
Albumin: 4.9 g/dL (ref 3.5–5.0)
Alkaline Phosphatase: 61 U/L (ref 38–126)
Anion gap: 12 (ref 5–15)
BUN: 11 mg/dL (ref 6–20)
CO2: 26 mmol/L (ref 22–32)
Calcium: 10.1 mg/dL (ref 8.9–10.3)
Chloride: 102 mmol/L (ref 98–111)
Creatinine, Ser: 0.7 mg/dL (ref 0.44–1.00)
GFR, Estimated: >60 mL/min (ref >60)
Glucose, Bld: 105 mg/dL — ABNORMAL HIGH (ref 70–99)
Potassium: 3.9 mmol/L (ref 3.5–5.1)
Sodium: 140 mmol/L (ref 135–145)
Total Bilirubin: 0.3 mg/dL (ref 0.3–1.2)
Total Protein: 8.4 g/dL — ABNORMAL HIGH (ref 6.5–8.1)

## 2022-04-28 LAB — CBC
HCT: 36.2 % (ref 36.0–46.0)
Hemoglobin: 12.2 g/dL (ref 12.0–15.0)
MCH: 29.4 pg (ref 26.0–34.0)
MCHC: 33.7 g/dL (ref 30.0–36.0)
MCV: 87.2 fL (ref 80.0–100.0)
Platelets: 244 10*3/uL (ref 150–400)
RBC: 4.15 MIL/uL (ref 3.87–5.11)
RDW: 12.6 % (ref 11.5–15.5)
WBC: 7.6 10*3/uL (ref 4.0–10.5)
nRBC: 0 % (ref 0.0–0.2)

## 2022-04-28 LAB — PREGNANCY, URINE: Preg Test, Ur: NEGATIVE

## 2022-04-28 LAB — URINALYSIS, ROUTINE W REFLEX MICROSCOPIC
Bilirubin Urine: NEGATIVE
Glucose, UA: NEGATIVE mg/dL
Hgb urine dipstick: NEGATIVE
Ketones, ur: NEGATIVE mg/dL
Leukocytes,Ua: NEGATIVE
Nitrite: NEGATIVE
Protein, ur: 30 mg/dL — AB
Specific Gravity, Urine: 1.027 (ref 1.005–1.030)
pH: 7 (ref 5.0–8.0)

## 2022-04-28 LAB — LIPASE, BLOOD: Lipase: 15 U/L (ref 11–51)

## 2022-04-28 MED ORDER — IOHEXOL 300 MG/ML  SOLN
100.0000 mL | Freq: Once | INTRAMUSCULAR | Status: AC | PRN
  Administered 2022-04-29: 100 mL via INTRAVENOUS

## 2022-04-28 MED ORDER — DICYCLOMINE HCL 10 MG PO CAPS
10.0000 mg | ORAL_CAPSULE | Freq: Once | ORAL | Status: DC
  Filled 2022-04-28: qty 1

## 2022-04-28 MED ORDER — ONDANSETRON HCL 4 MG/2ML IJ SOLN
4.0000 mg | Freq: Once | INTRAMUSCULAR | Status: DC
  Filled 2022-04-28: qty 2

## 2022-04-28 NOTE — ED Triage Notes (Signed)
Patient here POV from Home.  Endorses Upper ABD Pain Bilaterally that Radiates to Pelvic Area and Back. Pain was Present a Year ago and Patient had Gall Bladder removed.   Associated with Nausea, Diarrhea and Chills.   States Similar Pain this AM at 0600 approximately that somewhat subsided and states Pain returned at 1530 and has remained constant since.   NAD Noted during Triage. A&Ox4. GCS 15. Ambulatory.

## 2022-04-28 NOTE — ED Provider Notes (Signed)
[facility] EMERGENCY DEPT Provider Note   CSN: [id number] Arrival date & time: 04/28/22  2033     History {Add pertinent medical, surgical, social history, OB history to HPI:1} Chief Complaint  Patient presents with   Abdominal Pain    Alexandra Moreno is a 31 y.o. female.  HPI     This is a 31 year old female who presents with abdominal pain.  Patient reports that approximately 1 year ago she had similar pain that started in the epigastrium and radiated down into her pelvis.  At that time she was told she had gallstones and had her gallbladder removed this past March.  She states overall she had had improvement of her symptoms.  However, this morning she woke up with similar symptoms.  She describes pain that radiates from her epigastrium into her bilateral hips.  She states that it makes her double over.  She states "it feels like stones in my abdomen."  It is not related to food.  Patient states she has previously been on multiple medications for reflux and they have not helped.  She does have a history of frequent UTIs but has been told that her kidneys and bladder are "fine" after having ultrasound imaging.  Patient reports nausea and vomiting.  Home Medications Prior to Admission medications   Medication Sig Start Date End Date Taking? Authorizing Provider  ibuprofen (ADVIL,MOTRIN) 600 MG tablet Take 1 tablet (600 mg total) by mouth every 6 (six) hours. 12/18/14   Abram Sander, MD  Omega-3 Fatty Acids (FISH OIL PO) Take by mouth. Patient not taking: Reported on 06/27/2021    [provider]  OZEMPIC, 1 MG/DOSE, 4 MG/3ML SOPN As directed. Patient not taking: Reported on 06/27/2021 12/19/20   [provider]  predniSONE (STERAPRED UNI-PAK 21 TAB) 10 MG (21) TBPK tablet Take by mouth daily. Take 6 tabs by mouth daily  for 2 days, then 5 tabs for 2 days, then 4 tabs for 2 days, then 3 tabs for 2 days, 2 tabs for 2 days, then 1 tab by mouth daily for 2  days Patient not taking: Reported on 06/27/2021 05/13/21   Jacalyn Lefevre, MD  Prenatal Vit-Fe Fumarate-FA (PRENATAL MULTIVITAMIN) TABS tablet Take 1 tablet by mouth daily at 12 noon. Patient not taking: Reported on 06/27/2021    [provider]      Allergies    Tylenol [acetaminophen]    Review of Systems   Review of Systems  Gastrointestinal:  Positive for abdominal pain, nausea and vomiting.  All other systems reviewed and are negative.   Physical Exam Updated Vital Signs BP 122/80   Pulse 83   Temp 98.4 F (36.9 C)   Resp 18   Ht 1.664 m (5' 5.5")   Wt 97.6 kg   LMP 03/08/2014   SpO2 99%   BMI 35.26 kg/m  Physical Exam Vitals and nursing note reviewed.  Constitutional:      Appearance: She is well-developed. She is obese. She is not ill-appearing.  HENT:     Head: Normocephalic and atraumatic.  Eyes:     Pupils: Pupils are equal, round, and reactive to light.  Cardiovascular:     Rate and Rhythm: Normal rate and regular rhythm.     Heart sounds: Normal heart sounds.  Pulmonary:     Effort: Pulmonary effort is normal. No respiratory distress.     Breath sounds: No wheezing.  Abdominal:     General: Bowel sounds are normal.  Palpations: Abdomen is soft.     Tenderness: There is abdominal tenderness in the epigastric area. There is no guarding or rebound.  Musculoskeletal:     Cervical back: Neck supple.  Skin:    General: Skin is warm and dry.  Neurological:     Mental Status: She is alert and oriented to person, place, and time.  Psychiatric:        Mood and Affect: Mood normal.     ED Results / Procedures / Treatments   Labs (all labs ordered are listed, but only abnormal results are displayed) Labs Reviewed  COMPREHENSIVE METABOLIC PANEL - Abnormal; Notable for the following components:      Result Value   Glucose, Bld 105 (*)    Total Protein 8.4 (*)    All other components within normal limits  URINALYSIS, ROUTINE W REFLEX MICROSCOPIC  - Abnormal; Notable for the following components:   Protein, ur 30 (*)    Bacteria, UA RARE (*)    All other components within normal limits  LIPASE, BLOOD  CBC  PREGNANCY, URINE    EKG EKG Interpretation  Date/Time:  Sunday April 28 2022 20:42:33 EDT Ventricular Rate:  93 PR Interval:  144 QRS Duration: 80 QT Interval:  350 QTC Calculation: 435 R Axis:   90 Text Interpretation: Sinus rhythm with sinus arrhythmia with occasional Premature ventricular complexes Rightward axis Nonspecific ST abnormality Abnormal ECG When compared with ECG of 13-May-2021 17:14, Premature ventricular complexes are now Present Confirmed by Ross Marcus ([id number]) on 04/28/2022 10:56:53 PM  Radiology No results found.  Procedures Procedures  {Document cardiac monitor, telemetry assessment procedure when appropriate:1}  Medications Ordered in ED Medications  dicyclomine (BENTYL) capsule 10 mg (has no administration in time range)  ondansetron (ZOFRAN) injection 4 mg (has no administration in time range)    ED Course/ Medical Decision Making/ A&P                           Medical Decision Making Amount and/or Complexity of Data Reviewed Labs: ordered. Radiology: ordered.  Risk Prescription drug management.   ***  {Document critical care time when appropriate:1} {Document review of labs and clinical decision tools ie heart score, Chads2Vasc2 etc:1}  {Document your independent review of radiology images, and any outside records:1} {Document your discussion with family members, caretakers, and with consultants:1} {Document social determinants of health affecting pt's care:1} {Document your decision making why or why not admission, treatments were needed:1} Final Clinical Impression(s) / ED Diagnoses Final diagnoses:  None    Rx / DC Orders ED Discharge Orders     None

## 2022-04-29 ENCOUNTER — Emergency Department (HOSPITAL_BASED_OUTPATIENT_CLINIC_OR_DEPARTMENT_OTHER): Payer: BC Managed Care – PPO

## 2022-04-29 DIAGNOSIS — R1013 Epigastric pain: Secondary | ICD-10-CM | POA: Diagnosis not present

## 2022-04-29 MED ORDER — IOHEXOL 300 MG/ML  SOLN: 100.0000 mL | Freq: Once | INTRAMUSCULAR | Status: DC | PRN

## 2022-04-29 NOTE — Discharge Instructions (Signed)
You are seen today for abdominal pain.  The cause is unclear.  Follow-up with gastroenterology.  Restart your Protonix daily.

## 2022-05-22 DIAGNOSIS — K625 Hemorrhage of anus and rectum: Secondary | ICD-10-CM

## 2022-05-22 DIAGNOSIS — K219 Gastro-esophageal reflux disease without esophagitis: Secondary | ICD-10-CM

## 2022-05-22 HISTORY — DX: Gastro-esophageal reflux disease without esophagitis: K21.9

## 2022-05-22 HISTORY — DX: Hemorrhage of anus and rectum: K62.5

## 2022-06-28 ENCOUNTER — Emergency Department (HOSPITAL_COMMUNITY): Payer: BC Managed Care – PPO

## 2022-06-28 ENCOUNTER — Emergency Department (HOSPITAL_COMMUNITY)
Admission: EM | Admit: 2022-06-28 | Discharge: 2022-06-29 | Discharge status: Against Medical Advice | Payer: BC Managed Care – PPO | Attending: Emergency Medicine | Admitting: Emergency Medicine

## 2022-06-28 ENCOUNTER — Other Ambulatory Visit: Payer: Self-pay

## 2022-06-28 ENCOUNTER — Encounter (HOSPITAL_COMMUNITY): Payer: Self-pay

## 2022-06-28 DIAGNOSIS — R0602 Shortness of breath: Secondary | ICD-10-CM | POA: Insufficient documentation

## 2022-06-28 DIAGNOSIS — Z5321 Procedure and treatment not carried out due to patient leaving prior to being seen by health care provider: Secondary | ICD-10-CM | POA: Insufficient documentation

## 2022-06-28 DIAGNOSIS — R5383 Other fatigue: Secondary | ICD-10-CM | POA: Insufficient documentation

## 2022-06-28 DIAGNOSIS — R079 Chest pain, unspecified: Secondary | ICD-10-CM | POA: Diagnosis present

## 2022-06-28 DIAGNOSIS — R002 Palpitations: Secondary | ICD-10-CM | POA: Insufficient documentation

## 2022-06-28 LAB — CBC
HCT: 36.8 % (ref 36.0–46.0)
Hemoglobin: 12.3 g/dL (ref 12.0–15.0)
MCH: 29.4 pg (ref 26.0–34.0)
MCHC: 33.4 g/dL (ref 30.0–36.0)
MCV: 87.8 fL (ref 80.0–100.0)
Platelets: 223 10*3/uL (ref 150–400)
RBC: 4.19 MIL/uL (ref 3.87–5.11)
RDW: 13 % (ref 11.5–15.5)
WBC: 5 10*3/uL (ref 4.0–10.5)
nRBC: 0 % (ref 0.0–0.2)

## 2022-06-28 LAB — BASIC METABOLIC PANEL
Anion gap: 7 (ref 5–15)
BUN: 11 mg/dL (ref 6–20)
CO2: 25 mmol/L (ref 22–32)
Calcium: 9.6 mg/dL (ref 8.9–10.3)
Chloride: 107 mmol/L (ref 98–111)
Creatinine, Ser: 0.59 mg/dL (ref 0.44–1.00)
GFR, Estimated: >60 mL/min (ref >60)
Glucose, Bld: 135 mg/dL — ABNORMAL HIGH (ref 70–99)
Potassium: 3.5 mmol/L (ref 3.5–5.1)
Sodium: 139 mmol/L (ref 135–145)

## 2022-06-28 LAB — I-STAT BETA HCG BLOOD, ED (MC, WL, AP ONLY): I-stat hCG, quantitative: <5 m[IU]/mL (ref <5)

## 2022-06-28 LAB — TROPONIN I (HIGH SENSITIVITY): Troponin I (High Sensitivity): <2 ng/L (ref <18)

## 2022-06-28 NOTE — ED Provider Triage Note (Signed)
Emergency Medicine Provider Triage Evaluation Note  Alexandra Moreno , a 31 y.o. female  was evaluated in triage.  Pt complains of CP since this AM w some SOB. She had some heart racing and some pressure on her chest this AM. She states her chest pressure seems worse when she is walking around but this has not hampered her ability to walk.   States she's severely fatigued for months now and has had issues with rectal bleeding.   Review of Systems  Positive: CP, SOB Negative: Fever   Physical Exam  BP (!) 132/93   Pulse (!) 106   Temp 98.2 F (36.8 C) (Oral)   Resp 14   Ht 5\' 5"  (1.651 m)   Wt 98.9 kg   LMP 03/08/2014   SpO2 100%   BMI 36.28 kg/m  Gen:   Awake, no distress   Resp:  Normal effort  MSK:   Moves extremities without difficulty  Other:    Medical Decision Making  Medically screening exam initiated at 7:54 PM.  Appropriate orders placed.  03/10/2014 was informed that the remainder of the evaluation will be completed by another provider, this initial triage assessment does not replace that evaluation, and the importance of remaining in the ED until their evaluation is complete.  Labs, CXR already.    Added on TSH/T4 for when second troponin is drawn.    11-10-1999 Bennett, DOLE 06/28/22 1958

## 2022-06-28 NOTE — ED Triage Notes (Signed)
Pt endorses chest pain, SHOB, and palpitations upon waking this morning. Pt reports recent fatigue and feeling generally unwell for awhile now.

## 2022-07-15 ENCOUNTER — Encounter: Payer: Self-pay | Admitting: *Deleted

## 2022-10-03 ENCOUNTER — Encounter: Payer: Self-pay | Admitting: *Deleted

## 2022-10-23 ENCOUNTER — Encounter: Payer: Self-pay | Admitting: Cardiology

## 2022-10-23 ENCOUNTER — Encounter: Payer: Self-pay | Admitting: *Deleted

## 2022-11-05 NOTE — Progress Notes (Signed)
Cardiology Office Note:    Date:  11/06/2022   ID:  Alexandra Moreno, DOB [birth year], MRN [id number]  PCP:  Jeanie Sewer, NP  Cardiologist:  Shirlee More, MD   Referring MD: Ricard Dillon, NP  ASSESSMENT:    1. Palpitations   2. Cardiomegaly    PLAN:    In order of problems listed above:  Concern for cesarean bed and rapid heart rhythm quite suggestive of atrial fibrillation flutter and SVT but also with the uncertain history of heart disease the potential of ventricular tachycardia and his symptoms are quite profound would not apply a 30-day live monitor today also she will activate her watch to capture episodes She needs an echocardiogram to assess for structural heart disease 3  Next appointment 3 months    Medication Adjustments/Labs and Tests Ordered: Current medicines are reviewed at length with the patient today.  Concerns regarding medicines are outlined above.  Orders Placed This Encounter  Procedures   Cardiac event monitor   EKG 12-Lead   ECHOCARDIOGRAM COMPLETE   No orders of the defined types were placed in this encounter.     Chief plaint palpitation was told years ago my heart was enlarged never saw cardiologist  History of Present Illness:    Alexandra Moreno is a 32 y.o. female who is being seen today for the evaluation of rapid heartbeat at the request of Tetter, Devin B, NP.  She was recently seen by endocrinology for weight management.  Free T3 and free T4 were both normal.  TSH was normal.  Glucose 97 normal renal function creatinine 0.74.  She had a recent 7-day event monitor with her PCP sinus rhythm no arrhythmia noted.  There is a vague notation that she had cardiomegaly as a child.  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 This does usually last a few minutes but this last 1 lasted 30 minutes. She has a smart watch but has never set it up to capture EKG She wore an event monitor but did not have an episode The episodes no cardiovascular symptoms of edema shortness of breath chest pain  Past Medical History:  Diagnosis Date   Abnormal CT of the chest 07/04/2021   Anxiety    Asthma    Calculus of gallbladder with chronic cholecystitis without obstruction 11/02/2020   Chest pain 07/04/2021   Depression    Dyspnea 07/04/2021   Ectopic pregnancy    Emphysema lung ([place])    Sees a pulmologist currently being tested for emphysema   Gastroesophageal reflux disease without esophagitis 05/22/2022   Manic-depressive ([place])    Mental disorder    Rectal bleeding 05/22/2022    Past Surgical History:  Procedure Laterality Date   ADENOIDECTOMY AND MYRINGOTOMY WITH TUBE PLACEMENT     CHOLECYSTECTOMY  12/11/2021   RH   DILATION AND CURETTAGE OF UTERUS     LAPAROSCOPY FOR ECTOPIC PREGNANCY     RADICAL HYSTERECTOMY  06/2021   RH Dr. Modesta Messing from [facility], Endometriosis Complications   TUBAL LIGATION  04/2020    Current Medications: Current Meds  Medication Sig   Biotin (BIOTIN 5000) 5 MG CAPS Take 1 capsule by mouth 2 (two) times daily.   COLLAGEN PO Take 1 tablet by mouth daily.   Lactobacillus (PROBIOTIC ACIDOPHILUS PO) Take 1 capsule by mouth daily.   omeprazole (PRILOSEC) 40 MG capsule Take 40 mg by mouth daily.     Allergies:   Tylenol [acetaminophen]   Social History   Socioeconomic History   Marital status: Single    Spouse name: Not on file   Number of children: Not on file   Years of education: Not on file   Highest education level: Not on file  Occupational History   Not on file  Tobacco Use   Smoking status: Former     Packs/day: 1.00    Types: Cigarettes    Quit date: 12/28/2018    Years since quitting: 3.8    Passive exposure: Never   Smokeless tobacco: Never  Vaping Use   Vaping Use: Some days  Substance and Sexual Activity   Alcohol use: No    Comment: social before pregnancy   Drug use: No   Sexual activity: Yes  Other Topics Concern   Not on file  Social History Narrative   Not on file   Social Determinants of Health   Financial Resource Strain: Not on file  Food Insecurity: Not on file  Transportation Needs: Not on file  Physical Activity: Not on file  Stress: Not on file  Social Connections: Not on file     Family History: The patient's family history includes Atrial fibrillation (age of onset: 76) in her cousin; Diabetes in her paternal grandfather; Heart disease in her father; Hypertension in her father.  ROS:   ROS Please see the history of present illness.     All other systems reviewed and are negative.  EKGs/Labs/Other Studies Reviewed:    The following studies were reviewed today:   EKG:  EKG is sinus rhythm and is normal sinus rhythm and is normal ordered today.    Recent Labs: 04/28/2022: ALT 12 06/28/2022: BUN 11; Creatinine, Ser 0.59; Hemoglobin 12.3; Platelets 223; Potassium 3.5; Sodium 139  Recent Lipid Panel No results found for: "CHOL", "TRIG", "HDL", "CHOLHDL", "VLDL", "LDLCALC", "LDLDIRECT"  Physical Exam:    VS:  BP 112/70 (BP Location: Left Arm, Patient Position: Sitting)   Pulse 78   Ht 5\' 5"  (1.651 m)   Wt 244 lb (110.7 kg)   LMP 03/08/2014   SpO2 99%   BMI 40.60 kg/m     Wt Readings from Last 3 Encounters:  11/06/22 244 lb (110.7 kg)  10/11/22 239 lb (108.4 kg)  06/28/22 218 lb (98.9 kg)     GEN: Appears healthy well nourished, well developed in no acute distress HEENT: Normal NECK: No JVD; No carotid bruits LYMPHATICS: No lymphadenopathy CARDIAC: Normal cardiovascular exam RRR, no murmurs, rubs, gallops RESPIRATORY:  Clear to  auscultation without rales, wheezing or rhonchi  ABDOMEN: Soft, non-tender, non-distended MUSCULOSKELETAL:  No edema; No deformity  SKIN: Warm and dry NEUROLOGIC:  Alert and oriented x 3 PSYCHIATRIC:  Normal affect     Signed, Shirlee More, MD  11/06/2022 11:37 AM    [place]

## 2022-11-06 ENCOUNTER — Ambulatory Visit: Payer: BC Managed Care – PPO | Admitting: Cardiology

## 2022-11-06 ENCOUNTER — Encounter: Payer: Self-pay | Admitting: Cardiology

## 2022-11-06 ENCOUNTER — Ambulatory Visit: Payer: Medicaid Other | Attending: Cardiology | Admitting: Cardiology

## 2022-11-06 VITALS — BP 112/70 | HR 78 | Ht 65.0 in | Wt 244.0 lb

## 2022-11-06 DIAGNOSIS — R002 Palpitations: Secondary | ICD-10-CM | POA: Diagnosis not present

## 2022-11-06 DIAGNOSIS — I517 Cardiomegaly: Secondary | ICD-10-CM | POA: Diagnosis not present

## 2022-11-06 NOTE — Patient Instructions (Addendum)
Medication Instructions:  Your physician recommends that you continue on your current medications as directed. Please refer to the Current Medication list given to you today.  *If you need a refill on your cardiac medications before your next appointment, please call your pharmacy*   Lab Work: None If you have labs (blood work) drawn today and your tests are completely normal, you will receive your results only by: [place] (if you have MyChart) OR A paper copy in the mail If you have any lab test that is abnormal or we need to change your treatment, we will call you to review the results.   Testing/Procedures: Your physician has recommended that you wear an event monitor. Event monitors are medical devices that record the heart's electrical activity. Doctors most often [place] these monitors to diagnose arrhythmias. Arrhythmias are problems with the speed or rhythm of the heartbeat. The monitor is a small, portable device. You can wear one while you do your normal daily activities. This is usually used to diagnose what is causing palpitations/syncope (passing out).   Your physician has requested that you have an echocardiogram. Echocardiography is a painless test that uses sound waves to create images of your heart. It provides your doctor with information about the size and shape of your heart and how well your heart's chambers and valves are working. This procedure takes approximately one hour. There are no restrictions for this procedure. Please do NOT wear cologne, perfume, aftershave, or lotions (deodorant is allowed). Please arrive 15 minutes prior to your appointment time.    Follow-Up: At [facility], you and your health needs are our priority.  As part of our continuing mission to provide you with exceptional heart care, we have created designated Provider Care Teams.  These Care Teams include your primary Cardiologist (physician) and Advanced Practice Providers (APPs -   Physician Assistants and Nurse Practitioners) who all work together to provide you with the care you need, when you need it.  We recommend signing up for the patient portal called "MyChart".  Sign up information is provided on this After Visit Summary.  MyChart is used to connect with patients for Virtual Visits (Telemedicine).  Patients are able to view lab/test results, encounter notes, upcoming appointments, etc.  Non-urgent messages can be sent to your provider as well.   To learn more about what you can do with MyChart, go to [email or web address].    Your next appointment:   3 month(s)  Provider:   Shirlee More, MD  Other Instructions None

## 2022-11-13 ENCOUNTER — Telehealth: Payer: Self-pay

## 2022-11-13 ENCOUNTER — Ambulatory Visit (INDEPENDENT_AMBULATORY_CARE_PROVIDER_SITE_OTHER): Payer: Medicaid Other

## 2022-11-13 ENCOUNTER — Ambulatory Visit: Payer: Medicaid Other | Attending: Cardiology

## 2022-11-13 DIAGNOSIS — I517 Cardiomegaly: Secondary | ICD-10-CM

## 2022-11-13 DIAGNOSIS — R002 Palpitations: Secondary | ICD-10-CM

## 2022-11-13 LAB — ECHOCARDIOGRAM COMPLETE
Area-P 1/2: 3.95 cm2
S' Lateral: 2.9 cm

## 2022-11-13 LAB — TROPONIN T: Troponin T (Highly Sensitive): <6 ng/L (ref 0–14)

## 2022-11-13 NOTE — Telephone Encounter (Signed)
Patient came to the office to have her 30 day event monitor put on and while we were putting on her 30 day event monitor she informed me that she was having chest tightness with exertion and a feeling of pressure in her head. Checked her vitals and her blood pressure was 138/97. Spoke with Dr. Agustin Cree regarding her symptoms and he ordered high sensitivity troponin stat and also to follow up with Dr. Bettina Gavia when he returns.I relayed these orders to the patient and she was agreeable to this plan. Patient had her lab drawn and she had no further questions at this time.

## 2022-11-14 ENCOUNTER — Telehealth: Payer: Self-pay

## 2022-11-14 NOTE — Telephone Encounter (Signed)
-----  Message from Richardo Priest, MD sent at 11/13/2022  5:49 PM EST ----- Normal or stable result  Good result heart function size structure are all normal for age.  The trivial regurgitation is seen commonly in normal individuals  Overall very good result

## 2022-11-14 NOTE — Telephone Encounter (Signed)
Results reviewed with pt as per Dr. Munley's note.  Pt verbalized understanding and had no additional questions. Routed to PCP  

## 2022-11-19 ENCOUNTER — Telehealth: Payer: Self-pay | Admitting: Cardiology

## 2022-11-19 NOTE — Telephone Encounter (Signed)
Patient stated her heart monitor attachments are not staying stuck and she would like a call back to discuss next steps.

## 2022-11-20 NOTE — Telephone Encounter (Signed)
Called patient and she reported that her chest was red and inflamed where the leads for the heart monitor were placed. Spoke to Dr. Bettina Gavia and he recommended that she call the phone number on the box and speak to them about leads for people with sensitive skin. He also stated that he did not want her to put the regular leads back on if it was breaking out her skin, just send in the monitor if they didn't have leads for people with sensitive skin.. Patient stated that she would reach out to the company for leads for people with sensitive skin. Patient had no further questions at this time.

## 2022-11-27 ENCOUNTER — Telehealth: Payer: Self-pay

## 2022-11-27 NOTE — Telephone Encounter (Signed)
-----   Message from Park Liter, MD sent at 11/15/2022  8:54 AM EST ----- Troponin I was normal

## 2022-11-27 NOTE — Telephone Encounter (Signed)
Patient notified of results through my chart.

## 2022-12-03 ENCOUNTER — Encounter: Payer: Self-pay | Admitting: Cardiology

## 2022-12-09 ENCOUNTER — Other Ambulatory Visit: Payer: Self-pay

## 2022-12-09 MED ORDER — ACEBUTOLOL HCL 200 MG PO CAPS: 200.0000 mg | ORAL_CAPSULE | Freq: Every day | ORAL | 0 refills | Status: DC

## 2022-12-12 ENCOUNTER — Telehealth: Payer: Self-pay

## 2022-12-12 NOTE — Telephone Encounter (Signed)
Prior authorization for Acebutolol was denied per [place] [employer], does not meet the criteria for approval. Patient must first try one of the preferred drugs Atenolol, Carvedilol, Propranolol Immediate-release solution/tablet, Propranolol Extended-Release capsule, Sorine tablet, Sotalol/AF tablet.  Copy of denial letter in patient chart media.

## 2022-12-12 NOTE — Telephone Encounter (Signed)
Informed patient of PA denial for Acebutolol with her insurance. Patient states that she went to her primary care doctor was given prescription for metoprolol succinate 25 mg was instructed to take 1/2 tablet (12.5 mg) daily. Patient states that she has not started taking wanted to wait to see what Dr Bettina Gavia preferred her to take.  I told patient that Dr Bettina Gavia is out of the office will return on Monday I will forward to him to review. Patient voices understanding and agreed.

## 2022-12-12 NOTE — Telephone Encounter (Signed)
PA submitted with CMM for Acebutolol PA has been faxed to the plan as a paper copy. Please contact the plan directly if you haven't received a determination in a typical timeframe.  You will be notified of the determination via fax.

## 2022-12-16 NOTE — Telephone Encounter (Signed)
Called the patient and informed her of Dr. Joya Gaskins recommendation below:  Both are good Can pay out of pocket for acebutolol Or use metoprolol It is a pharmacy benefit formulary issue Both are generics   The patient stated that she would start the metoprolol and see how that worked and if it helped relieve her symptoms. Patient had no further questions at this time.

## 2022-12-30 ENCOUNTER — Telehealth: Payer: Self-pay

## 2022-12-30 NOTE — Telephone Encounter (Signed)
Called patient and informed her of the results of her heart monitor. Patient also stated that she had asked Dr. Bettina Gavia if she could be tested for POTS but was not sure of the answer. She also stated that her heart rate will go from 40's to 150's and she feels like she is going to pass out and she gets dizzy. She stated that while I was on the phone with her, her heart rate was in the 130's. She has also been having very bad migraines.Please advise.

## 2023-01-30 ENCOUNTER — Other Ambulatory Visit: Payer: Self-pay | Admitting: Cardiology

## 2023-01-30 NOTE — Telephone Encounter (Signed)
Rx to pharmacy, patient has appointment 02/12/23

## 2023-01-31 ENCOUNTER — Telehealth: Payer: Self-pay

## 2023-01-31 LAB — CMP 10231: EGFR: 60

## 2023-01-31 NOTE — Telephone Encounter (Signed)
Prior authorization request received from patients pharmacy [facility] [place], [place] for Acebutolol HCI 200 mg

## 2023-01-31 NOTE — Telephone Encounter (Signed)
Acebutolol will not be covered under patient's Medicaid plan until patient tries and fails the metoprolol she was prescribed by PCP

## 2023-01-31 NOTE — Telephone Encounter (Signed)
Per 12/12/22 encounter PA was denied for Acebutolol. Are we requesting for an appeal?  [facility], PLEASE ADVISE

## 2023-02-03 ENCOUNTER — Other Ambulatory Visit: Payer: Self-pay

## 2023-02-03 NOTE — Telephone Encounter (Signed)
Called patient and informed her that her Acebutolol was not covered by her medicaid plan until she fails the metoprolol that she was prescribed by her PCP. Patient stated that she only paid $11 for her last prescription of Acebutolol and she was comfortable paying out of pocket for the medication. Patient had no further questions at this time.

## 2023-02-05 DIAGNOSIS — J439 Emphysema, unspecified: Secondary | ICD-10-CM | POA: Insufficient documentation

## 2023-02-05 DIAGNOSIS — O009 Unspecified ectopic pregnancy without intrauterine pregnancy: Secondary | ICD-10-CM | POA: Insufficient documentation

## 2023-02-05 DIAGNOSIS — F99 Mental disorder, not otherwise specified: Secondary | ICD-10-CM | POA: Insufficient documentation

## 2023-02-05 DIAGNOSIS — F32A Depression, unspecified: Secondary | ICD-10-CM | POA: Insufficient documentation

## 2023-02-05 DIAGNOSIS — F319 Bipolar disorder, unspecified: Secondary | ICD-10-CM | POA: Insufficient documentation

## 2023-02-05 DIAGNOSIS — F419 Anxiety disorder, unspecified: Secondary | ICD-10-CM | POA: Insufficient documentation

## 2023-02-05 DIAGNOSIS — J45909 Unspecified asthma, uncomplicated: Secondary | ICD-10-CM | POA: Insufficient documentation

## 2023-02-11 NOTE — Progress Notes (Deleted)
Cardiology Office Note:    Date:  02/11/2023   ID:  Alexandra Moreno, DOB 04/14/1991, MRN [id number]  PCP:  Alexandra Sellar, NP  Cardiologist:  Alexandra Herrlich, MD    Referring MD: Alexandra Sellar, NP    ASSESSMENT:    No diagnosis found. PLAN:    In order of problems listed above:  ***   Next appointment: ***   Medication Adjustments/Labs and Tests Ordered: Current medicines are reviewed at length with the patient today.  Concerns regarding medicines are outlined above.  No orders of the defined types were placed in this encounter.  No orders of the defined types were placed in this encounter.   No chief complaint on file.   History of Present Illness:    Alexandra Moreno is a 32 y.o. female with a hx of very bothersome palpitation last seen 11/06/2022.  Following the visit she had an echocardiogram performed 11/13/2022 showing a structurally normal heart normal left ventricular size wall thickness GLS systolic diastolic function normal right ventricular size and function and no significant valvular abnormality.  She utilized a 30-day event monitor reported 12/30/2022 or 11 stable events all associated with sinus rhythm or sinus tachycardia there is no SVT atrial fibrillation or bradycardia noted.  She was seen [facility] ED 11/01/2022 for abdominal pain and diarrhea.  She is afebrile heart rate was 75 blood pressure 123/62.  CBC was normal BMP was normal potassium 3.6 creatinine 0.7.  She was discharged from the emergency room for outpatient care.  Compliance with diet, lifestyle and medications: *** Past Medical History:  Diagnosis Date   Abnormal CT of the chest 07/04/2021   Anxiety    Asthma    Calculus of gallbladder with chronic cholecystitis without obstruction 11/02/2020   Chest pain 07/04/2021   Depression    Dyspnea 07/04/2021   Ectopic pregnancy    Emphysema lung (HCC)    Sees a pulmologist currently being tested for emphysema   Gastroesophageal  reflux disease without esophagitis 05/22/2022   Manic-depressive (HCC)    Mental disorder    Rectal bleeding 05/22/2022    Past Surgical History:  Procedure Laterality Date   ADENOIDECTOMY AND MYRINGOTOMY WITH TUBE PLACEMENT     CHOLECYSTECTOMY  12/11/2021   RH   DILATION AND CURETTAGE OF UTERUS     LAPAROSCOPY FOR ECTOPIC PREGNANCY     RADICAL HYSTERECTOMY  06/2021   RH Dr. Mel Moreno from [facility], Endometriosis Complications   TUBAL LIGATION  04/2020    Current Medications: No outpatient medications have been marked as taking for the 02/12/23 encounter (Appointment) with Alexandra Daub, MD.     Allergies:   Tylenol [acetaminophen]   Social History   Socioeconomic History   Marital status: Single    Spouse name: Not on file   Number of children: Not on file   Years of education: Not on file   Highest education level: Not on file  Occupational History   Not on file  Tobacco Use   Smoking status: Former    Packs/day: 1    Types: Cigarettes    Quit date: 12/28/2018    Years since quitting: 4.1    Passive exposure: Never   Smokeless tobacco: Never  Vaping Use   Vaping Use: Some days  Substance and Sexual Activity   Alcohol use: No    Comment: social before pregnancy   Drug use: No   Sexual activity: Yes  Other Topics Concern   Not on file  Social  History Narrative   Not on file   Social Determinants of Health   Financial Resource Strain: Not on file  Food Insecurity: Not on file  Transportation Needs: Not on file  Physical Activity: Not on file  Stress: Not on file  Social Connections: Not on file     Family History: The patient's ***family history includes Atrial fibrillation (age of onset: 63) in her cousin; Diabetes in her paternal grandfather; Heart disease in her father; Hypertension in her father. ROS:   Please see the history of present illness.    All other systems reviewed and are negative.  EKGs/Labs/Other Studies Reviewed:    The following  studies were reviewed today:  Cardiac Studies & Procedures       ECHOCARDIOGRAM  ECHOCARDIOGRAM COMPLETE 11/13/2022  Narrative ECHOCARDIOGRAM REPORT    Patient Name:   Alexandra Moreno Date of Exam: 11/13/2022 Medical Rec #:  [id number]     Height:       65.0 in Accession #:    [id number]    Weight:       244.0 lb Date of Birth:  12-30-1990     BSA:          2.153 m Patient Age:    31 years      BP:           112/70 mmHg Patient Gender: F             HR:           70 bpm. Exam Location:  [place]  Procedure: 2D Echo, Cardiac Doppler, Color Doppler and Strain Analysis  Indications:    Cardiomegaly I51.7  History:        Patient has no prior history of Echocardiogram examinations. Signs/Symptoms:Chest Pain and Dyspnea; Risk Factors:family history includes Atrial fibrillation.  Sonographer:    Alexandra Moreno RDCS Referring Phys: [id number] Alexandra Moreno J Alexandra Moreno  IMPRESSIONS   1. GLS -19.8. Left ventricular ejection fraction, by estimation, is 60 to 65%. The left ventricle has normal function. The left ventricle has no regional wall motion abnormalities. Left ventricular diastolic parameters were normal. 2. Right ventricular systolic function is normal. The right ventricular size is normal. There is normal pulmonary artery systolic pressure. 3. The mitral valve is normal in structure. Trivial mitral valve regurgitation. No evidence of mitral stenosis. 4. The aortic valve is normal in structure. Aortic valve regurgitation is not visualized. No aortic stenosis is present. 5. The inferior vena cava is normal in size with greater than 50% respiratory variability, suggesting right atrial pressure of 3 mmHg.  FINDINGS Left Ventricle: GLS -19.8. Left ventricular ejection fraction, by estimation, is 60 to 65%. The left ventricle has normal function. The left ventricle has no regional wall motion abnormalities. The left ventricular internal cavity size was normal in size. There is no left ventricular  hypertrophy. Left ventricular diastolic parameters were normal.  Right Ventricle: The right ventricular size is normal. No increase in right ventricular wall thickness. Right ventricular systolic function is normal. There is normal pulmonary artery systolic pressure. The tricuspid regurgitant velocity is 2.04 m/s, and with an assumed right atrial pressure of 3 mmHg, the estimated right ventricular systolic pressure is 19.6 mmHg.  Left Atrium: Left atrial size was normal in size.  Right Atrium: Right atrial size was normal in size.  Pericardium: There is no evidence of pericardial effusion.  Mitral Valve: The mitral valve is normal in structure. Trivial mitral valve regurgitation. No evidence of mitral valve stenosis.  Tricuspid Valve: The tricuspid valve is normal in structure. Tricuspid valve regurgitation is trivial. No evidence of tricuspid stenosis.  Aortic Valve: The aortic valve is normal in structure. Aortic valve regurgitation is not visualized. No aortic stenosis is present.  Pulmonic Valve: The pulmonic valve was normal in structure. Pulmonic valve regurgitation is not visualized. No evidence of pulmonic stenosis.  Aorta: The aortic root is normal in size and structure.  Venous: The inferior vena cava is normal in size with greater than 50% respiratory variability, suggesting right atrial pressure of 3 mmHg.  IAS/Shunts: No atrial level shunt detected by color flow Doppler.   LEFT VENTRICLE PLAX 2D LVIDd:         5.20 cm   Diastology LVIDs:         2.90 cm   LV e' medial:    10.60 cm/s LV PW:         0.90 cm   LV E/e' medial:  10.3 LV IVS:        0.80 cm   LV e' lateral:   13.90 cm/s LVOT diam:     2.00 cm   LV E/e' lateral: 7.8 LV SV:         75 LV SV Index:   35 LVOT Area:     3.14 cm   RIGHT VENTRICLE             IVC RV S prime:     14.00 cm/s  IVC diam: 1.70 cm TAPSE (M-mode): 2.8 cm  LEFT ATRIUM             Index        RIGHT ATRIUM           Index LA diam:         3.30 cm 1.53 cm/m   RA Area:     12.50 cm LA Vol (A2C):   35.7 ml 16.58 ml/m  RA Volume:   26.30 ml  12.22 ml/m LA Vol (A4C):   38.3 ml 17.79 ml/m LA Biplane Vol: 38.3 ml 17.79 ml/m AORTIC VALVE LVOT Vmax:   113.00 cm/s LVOT Vmean:  75.900 cm/s LVOT VTI:    0.239 m  AORTA Ao Root diam: 2.60 cm Ao Asc diam:  3.10 cm Ao Desc diam: 2.00 cm  MITRAL VALVE                TRICUSPID VALVE MV Area (PHT): 3.95 cm     TR Peak grad:   16.6 mmHg MV Decel Time: 192 msec     TR Vmax:        204.00 cm/s MV E velocity: 109.00 cm/s MV A velocity: 70.00 cm/s   SHUNTS MV E/A ratio:  1.56         Systemic VTI:  0.24 m Systemic Diam: 2.00 cm  Gypsy Balsam MD Electronically signed by Gypsy Balsam MD Signature Date/Time: 11/13/2022/4:09:50 PM    Final    MONITORS  CARDIAC EVENT MONITOR 12/30/2022  Narrative She was event monitor from 11/13/2022 to 12/17/2022.  There were no episodes of atrial fibrillation flutter or SVT.  There were 11 stable events all associated with sinus rhythm or sinus tachycardia.  There is no bradycardia present pauses of 3 seconds or greater or second or third-degree AV nodal block.           EKG:  EKG ordered today and personally reviewed.  The ekg ordered today demonstrates ***  Recent Labs: 04/28/2022: ALT 12 06/28/2022: BUN 11; Creatinine,  Ser 0.59; Hemoglobin 12.3; Platelets 223; Potassium 3.5; Sodium 139  Recent Lipid Panel No results found for: "CHOL", "TRIG", "HDL", "CHOLHDL", "VLDL", "LDLCALC", "LDLDIRECT"  Physical Exam:    VS:  LMP 03/08/2014     Wt Readings from Last 3 Encounters:  11/06/22 244 lb (110.7 kg)  10/11/22 239 lb (108.4 kg)  06/28/22 218 lb (98.9 kg)     GEN: *** Well nourished, well developed in no acute distress HEENT: Normal NECK: No JVD; No carotid bruits LYMPHATICS: No lymphadenopathy CARDIAC: ***RRR, no murmurs, rubs, gallops RESPIRATORY:  Clear to auscultation without rales, wheezing or rhonchi  ABDOMEN:  Soft, non-tender, non-distended MUSCULOSKELETAL:  No edema; No deformity  SKIN: Warm and dry NEUROLOGIC:  Alert and oriented x 3 PSYCHIATRIC:  Normal affect    Signed, Alexandra Herrlich, MD  02/11/2023 8:11 PM    [facility]

## 2023-02-12 ENCOUNTER — Ambulatory Visit: Payer: Medicaid Other | Attending: Cardiology | Admitting: Cardiology

## 2023-02-18 ENCOUNTER — Encounter: Payer: Self-pay | Admitting: Cardiology

## 2023-02-19 ENCOUNTER — Encounter: Payer: Self-pay | Admitting: Neurology

## 2023-02-19 ENCOUNTER — Ambulatory Visit: Payer: Medicaid Other | Admitting: Neurology

## 2023-02-19 VITALS — BP 109/69 | HR 69 | Ht 65.5 in | Wt 258.4 lb

## 2023-02-19 DIAGNOSIS — G932 Benign intracranial hypertension: Secondary | ICD-10-CM | POA: Diagnosis not present

## 2023-02-19 DIAGNOSIS — G441 Vascular headache, not elsewhere classified: Secondary | ICD-10-CM

## 2023-02-19 DIAGNOSIS — H9193 Unspecified hearing loss, bilateral: Secondary | ICD-10-CM

## 2023-02-19 DIAGNOSIS — H539 Unspecified visual disturbance: Secondary | ICD-10-CM

## 2023-02-19 DIAGNOSIS — R519 Headache, unspecified: Secondary | ICD-10-CM

## 2023-02-19 DIAGNOSIS — R42 Dizziness and giddiness: Secondary | ICD-10-CM

## 2023-02-19 DIAGNOSIS — G4719 Other hypersomnia: Secondary | ICD-10-CM | POA: Diagnosis not present

## 2023-02-19 DIAGNOSIS — H4710 Unspecified papilledema: Secondary | ICD-10-CM

## 2023-02-19 DIAGNOSIS — G43711 Chronic migraine without aura, intractable, with status migrainosus: Secondary | ICD-10-CM | POA: Diagnosis not present

## 2023-02-19 DIAGNOSIS — R51 Headache with orthostatic component, not elsewhere classified: Secondary | ICD-10-CM

## 2023-02-19 MED ORDER — FREMANEZUMAB-VFRM 225 MG/1.5ML ~~LOC~~ SOSY
225.0000 mg | PREFILLED_SYRINGE | Freq: Once | SUBCUTANEOUS | Status: AC
Start: 2023-02-19 — End: ?

## 2023-02-19 MED ORDER — RIZATRIPTAN BENZOATE 10 MG PO TBDP
10.0000 mg | ORAL_TABLET | ORAL | 11 refills | Status: DC | PRN
Start: 2023-02-19 — End: 2023-04-11

## 2023-02-19 MED ORDER — AJOVY 225 MG/1.5ML ~~LOC~~ SOAJ
225.0000 mg | SUBCUTANEOUS | 11 refills | Status: DC
Start: 2023-02-19 — End: 2023-04-11

## 2023-02-19 MED ORDER — KETOROLAC TROMETHAMINE 60 MG/2ML IM SOLN
60.0000 mg | Freq: Once | INTRAMUSCULAR | Status: AC
Start: 2023-02-19 — End: 2023-02-19
  Administered 2023-02-19: 60 mg via INTRAMUSCULAR

## 2023-02-19 MED ORDER — ONDANSETRON 4 MG PO TBDP
4.0000 mg | ORAL_TABLET | Freq: Three times a day (TID) | ORAL | 3 refills | Status: DC | PRN
Start: 2023-02-19 — End: 2023-04-11

## 2023-02-19 NOTE — Patient Instructions (Addendum)
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, possibly Sleep apnea   - Sleep evaluation: she wakes up in the morning headaches, she dozes off and can type notes and doze  - Excedrin no more than 10 days a month - see below analgesic rebound headache - EMERGENCY:Onset headache: 10mg  Rizatriptan and 4mg  ondansetron:  repeat in 2 hours - if that doesn't work Clinical cytogeneticist me and try nurtec or ubrelvy - Prevention Ajovy - had hysterectomy once a month injection - MRI Brain w/wo contrast and MRV  - Lumbar puncture for IDIOPATHIC INTRACRANIAL HYPERTENSION - if high, treat IDIOPATHIC INTRACRANIAL HYPERTENSION and send to send ophthalmology - 12 weeks meet again - Toradol shot     Idiopathic Intracranial Hypertension  Idiopathic intracranial hypertension (IIH) is a condition that increases pressure around the brain. The fluid that surrounds the brain and spinal cord (cerebrospinal fluid, or CSF) increases and causes the pressure. Idiopathic means that the cause of this condition is not known. IIH affects the brain and spinal cord. If this condition is not treated, it can cause vision loss or blindness. What are the causes? The cause of this condition is not known. What increases the risk? The following factors may make you more likely to develop this condition: Being obese. Being a person who is female, between the ages of 25 and 87 years old, and who has not gone through menopause. Taking certain medicines, such as birth control, acne medicines, or steroids. What are the signs or symptoms? Symptoms of this condition include: Headaches. This is the most common symptom. Brief periods of total blindness. Double vision, blurred vision, or poor side (peripheral) vision. Pain in the shoulders or neck. Nausea and vomiting. A sound like rushing water or a pulsing sound within the ears (pulsatile tinnitus), or ringing in the ears. How is this  diagnosed? This condition may be diagnosed based on: Your symptoms and medical history. Imaging tests of the brain, such as: CT scan. MRI. Magnetic resonance venogram (MRV) to check the veins. Diagnostic lumbar puncture. This is a procedure to remove and examine a sample of CSF. This procedure can determine whether your fluid pressure is too high. An eye exam to check for swelling or nerve damage in the eyes. How is this treated? Treatment for this condition depends on the symptoms. The goal of treatment is to decrease the pressure around your brain. Common treatments include: Weight loss through healthy eating, salt restriction, and exercise, if you are overweight. Medicines to decrease the production of CSF and lower the pressure within your skull. Medicines to prevent or treat headaches. Other treatments may include: Surgery to place drains (shunts) in your brain to remove extra fluid. Lumbar puncture to remove extra CSF. Follow these instructions at home: If you are overweight or obese, work with your health care provider to lose weight. Take over-the-counter and prescription medicines only as told by your health care provider. Ask your health care provider if the medicine prescribed to you requires you to avoid driving or using machinery. Do not use any products that contain nicotine or tobacco. These products include cigarettes, chewing tobacco, and vaping devices, such as e-cigarettes. If you need help quitting, ask your health care provider. Keep all follow-up visits. Your health care provider will need to monitor you regularly. Contact a health care provider if: You have changes in your vision, such as: Double vision. Blurred vision. Poor peripheral vision. Get help right away if: You have any of the following symptoms and  they get worse or do not get better: Headaches. Nausea. Vomiting. Sudden trouble seeing. This information is not intended to replace advice given to you  by your health care provider. Make sure you discuss any questions you have with your health care provider. Document Revised: 03/05/2022 Document Reviewed: 02/12/2022 Elsevier Patient Education  2023 [employer]    Sleep Apnea Sleep apnea is a condition in which breathing pauses or becomes shallow during sleep. People with sleep apnea usually snore loudly. They may have times when they gasp and stop breathing for 10 seconds or more during sleep. This may happen many times during the night. Sleep apnea disrupts your sleep and keeps your body from getting the rest that it needs. This condition can increase your risk of certain health problems, including: Heart attack. Stroke. Obesity. Type 2 diabetes. Heart failure. Irregular heartbeat. High blood pressure. The goal of treatment is to help you breathe normally again. What are the causes?  The most common cause of sleep apnea is a collapsed or blocked airway. There are three kinds of sleep apnea: Obstructive sleep apnea. This kind is caused by a blocked or collapsed airway. Central sleep apnea. This kind happens when the part of the brain that controls breathing does not send the correct signals to the muscles that control breathing. Mixed sleep apnea. This is a combination of obstructive and central sleep apnea. What increases the risk? You are more likely to develop this condition if you: Are overweight. Smoke. Have a smaller than normal airway. Are older. Are female. Drink alcohol. Take sedatives or tranquilizers. Have a family history of sleep apnea. Have a tongue or tonsils that are larger than normal. What are the signs or symptoms? Symptoms of this condition include: Trouble staying asleep. Loud snoring. Morning headaches. Waking up gasping. Dry mouth or sore throat in the morning. Daytime sleepiness and tiredness. If you have daytime fatigue because of sleep apnea, you may be more likely to have: Trouble  concentrating. Forgetfulness. Irritability or mood swings. Personality changes. Feelings of depression. Sexual dysfunction. This may include loss of interest if you are female, or erectile dysfunction if you are female. How is this diagnosed? This condition may be diagnosed with: A medical history. A physical exam. A series of tests that are done while you are sleeping (sleep study). These tests are usually done in a sleep lab, but they may also be done at home. How is this treated? Treatment for this condition aims to restore normal breathing and to ease symptoms during sleep. It may involve managing health issues that can affect breathing, such as high blood pressure or obesity. Treatment may include: Sleeping on your side. Using a decongestant if you have nasal congestion. Avoiding the use of depressants, including alcohol, sedatives, and narcotics. Losing weight if you are overweight. Making changes to your diet. Quitting smoking. Using a device to open your airway while you sleep, such as: An oral appliance. This is a custom-made mouthpiece that shifts your lower jaw forward. A continuous positive airway pressure (CPAP) device. This device blows air through a mask when you breathe out (exhale). A nasal expiratory positive airway pressure (EPAP) device. This device has valves that you put into each nostril. A bi-level positive airway pressure (BIPAP) device. This device blows air through a mask when you breathe in (inhale) and breathe out (exhale). Having surgery if other treatments do not work. During surgery, excess tissue is removed to create a wider airway. Follow these instructions at home: Lifestyle  Make any lifestyle changes that your health care provider recommends. Eat a healthy, well-balanced diet. Take steps to lose weight if you are overweight. Avoid using depressants, including alcohol, sedatives, and narcotics. Do not use any products that contain nicotine or tobacco.  These products include cigarettes, chewing tobacco, and vaping devices, such as e-cigarettes. If you need help quitting, ask your health care provider. General instructions Take over-the-counter and prescription medicines only as told by your health care provider. If you were given a device to open your airway while you sleep, use it only as told by your health care provider. If you are having surgery, make sure to tell your health care provider you have sleep apnea. You may need to bring your device with you. Keep all follow-up visits. This is important. Contact a health care provider if: The device that you received to open your airway during sleep is uncomfortable or does not seem to be working. Your symptoms do not improve. Your symptoms get worse. Get help right away if: You develop: Chest pain. Shortness of breath. Discomfort in your back, arms, or stomach. You have: Trouble speaking. Weakness on one side of your body. Drooping in your face. These symptoms may represent a serious problem that is an emergency. Do not wait to see if the symptoms will go away. Get medical help right away. Call your local emergency services (911 in the [place] Do not drive yourself to the hospital. Summary Sleep apnea is a condition in which breathing pauses or becomes shallow during sleep. The most common cause is a collapsed or blocked airway. The goal of treatment is to restore normal breathing and to ease symptoms during sleep. This information is not intended to replace advice given to you by your health care provider. Make sure you discuss any questions you have with your health care provider.  Analgesic Rebound Headache An analgesic rebound headache is a secondary disorder that is caused by the overuse of pain medicine (analgesic) to treat the original (primary) headache. It is sometimes called a medication overuse headache or a drug-induced headache. Any type of primary headache can return as a  rebound headache if a person regularly takes pain medicine. The types of primary headaches that are commonly related to rebound headaches include: Migraines. Tension headaches. These are caused by tense muscles in the head and neck area. Cluster headaches. These happen on one side of the head and around the eye. If rebound headaches continue, they can become long-term, daily headaches. What are the causes? Rebound headaches may be caused by frequent use of: Over-the-counter medicines, such as aspirin, ibuprofen, and acetaminophen. Sinus-relief medicines. Medicines that contain caffeine. Narcotic pain medicines, such as codeine and oxycodone. Some prescription migraine medicines. What are the signs or symptoms? The symptoms of a rebound headache are the same as the symptoms of the primary headache. Symptoms of specific types of headaches include: Migraine headache Pulsing or throbbing pain on one or both sides of the head. Severe pain that makes it hard to do daily activities. Pain that gets worse with physical activity. Nausea, vomiting, or both. Pain that may get worse around bright lights, loud noises, or smells. Vision changes. Numbness in one or both arms. Tension headache Pressure around the head. Dull, aching head pain. Pain felt over the front and sides of the head. Tenderness in the muscles of the head, neck, and shoulders. Cluster headache Severe pain that begins in or around one eye or temple. Droopy or swollen eyelid, or redness  and tearing in the eye on the same side as the pain. One-sided head pain. Nausea. Runny nose. Sweaty, pale facial skin. Restlessness. How is this diagnosed? Rebound headaches are diagnosed by reviewing: Your medical history, including a description of your primary headaches. Your pain medicines for your primary headaches and how often you take them. How is this treated? Rebound headaches may be treated or managed by: Stopping frequent use  of pain medicine. This may make your headaches worse at first, but the pain should then become less manageable and less frequent and severe. Seeing a headache specialist. They may be able to help you manage your headaches and help make sure there is not another cause of the headaches. Using methods of stress relief, such as acupuncture, counseling, biofeedback, and massage. Follow these instructions at home: Medicines  Take over-the-counter and prescription medicines only as told by your health care provider. Stop the repeated use of pain medicine as told by your provider. Stopping can be hard. Carefully follow instructions from your provider. Lifestyle Do not drink alcohol. Do not use any products that contain nicotine or tobacco. These products include cigarettes, chewing tobacco, and vaping devices, such as e-cigarettes. If you need help quitting, ask your provider. Get 7-9 hours of sleep each night, or the amount recommended by your provider. Find ways to manage stress, such as acupuncture, counseling, biofeedback, and massage. Exercise regularly. Exercise for at least 30 minutes, 5 times each week. General instructions Avoid things that may bring on (trigger) your primary headaches, such as certain foods. Contact a health care provider if: Medicine does not help your migraine. Your pain keeps coming back even with medicine. Get help right away if: You have new headache pain. You have headache pain that is different than what you have felt in the past. You have numbness or tingling in your arms or legs. You have changes in your speech or vision. This information is not intended to replace advice given to you by your health care provider. Make sure you discuss any questions you have with your health care provider. Document Revised: 06/03/2022 Document Reviewed: 06/03/2022 Elsevier Patient Education  2023 [employer] Inc. Ondansetron Dissolving Tablets What is this  medication? ONDANSETRON (on DAN se tron) prevents nausea and vomiting from chemotherapy, radiation, or surgery. It works by blocking substances in the body that may cause nausea or vomiting. It belongs to a group of medications called antiemetics. This medicine may be used for other purposes; ask your health care provider or pharmacist if you have questions. COMMON BRAND NAME(S): Zofran ODT What should I tell my care team before I take this medication? They need to know if you have any of these conditions: Heart disease History of irregular heartbeat Liver disease Low levels of magnesium or potassium in the blood An unusual or allergic reaction to ondansetron, granisetron, other medications, foods, dyes, or preservatives Pregnant or trying to get pregnant Breast-feeding How should I use this medication? These tablets are made to dissolve in the mouth. Do not try to push the tablet through the foil backing. With dry hands, peel away the foil backing and gently remove the tablet. Place the tablet in the mouth and allow it to dissolve, then swallow. While you may take these tablets with water, it is not necessary to do so. Talk to your care team regarding the use of this medication in children. Special care may be needed. Overdosage: If you think you have taken too much of this medicine contact a poison  control center or emergency room at once. NOTE: This medicine is only for you. Do not share this medicine with others. What if I miss a dose? If you miss a dose, take it as soon as you can. If it is almost time for your next dose, take only that dose. Do not take double or extra doses. What may interact with this medication? Do not take this medication with any of the following: Apomorphine Certain medications for fungal infections like fluconazole, itraconazole, ketoconazole, posaconazole, voriconazole Cisapride Dronedarone Pimozide Thioridazine This medication may also interact with the  following: Carbamazepine Certain medications for depression, anxiety, or psychotic disturbances Fentanyl Linezolid MAOIs like Carbex, Eldepryl, Marplan, Nardil, and Parnate Methylene blue (injected into a vein) Other medications that prolong the QT interval (cause an abnormal heart rhythm) like dofetilide, ziprasidone Phenytoin Rifampicin Tramadol This list may not describe all possible interactions. Give your health care provider a list of all the medicines, herbs, non-prescription drugs, or dietary supplements you use. Also tell them if you smoke, drink alcohol, or use illegal drugs. Some items may interact with your medicine. What should I watch for while using this medication? Check with your care team as soon as you can if you have any sign of an allergic reaction. What side effects may I notice from receiving this medication? Side effects that you should report to your care team as soon as possible: Allergic reactions--skin rash, itching, hives, swelling of the face, lips, tongue, or throat Bowel blockage--stomach cramping, unable to have a bowel movement or pass gas, loss of appetite, vomiting Chest pain (angina)--pain, pressure, or tightness in the chest, neck, back, or arms Heart rhythm changes--fast or irregular heartbeat, dizziness, feeling faint or lightheaded, chest pain, trouble breathing Irritability, confusion, fast or irregular heartbeat, muscle stiffness, twitching muscles, sweating, high fever, seizure, chills, vomiting, diarrhea, which may be signs of serotonin syndrome Side effects that usually do not require medical attention (report to your care team if they continue or are bothersome): Constipation Diarrhea General discomfort and fatigue Headache This list may not describe all possible side effects. Call your doctor for medical advice about side effects. You may report side effects to FDA at 1-800-FDA-1088. Where should I keep my medication? Keep out of the reach  of children and pets. Store between 2 and 30 degrees C (36 and 86 degrees F). Throw away any unused medication after the expiration date. NOTE: This sheet is a summary. It may not cover all possible information. If you have questions about this medicine, talk to your doctor, pharmacist, or health care provider.  2023 Elsevier/Gold Standard (2007-11-28 00:00:00) Rizatriptan Disintegrating Tablets What is this medication? RIZATRIPTAN (rye za TRIP tan) treats migraines. It works by blocking pain signals and narrowing blood vessels in the brain. It belongs to a group of medications called triptans. It is not used to prevent migraines. This medicine may be used for other purposes; ask your health care provider or pharmacist if you have questions. COMMON BRAND NAME(S): Maxalt-MLT What should I tell my care team before I take this medication? They need to know if you have any of these conditions: Circulation problems in fingers and toes Diabetes Heart disease High blood pressure High cholesterol History of irregular heartbeat History of stroke Stomach or intestine problems Tobacco use An unusual or allergic reaction to rizatriptan, other medications, foods, dyes, or preservatives Pregnant or trying to get pregnant Breast-feeding How should I use this medication? Take this medication by mouth. Take it as directed on  the prescription label. You do not need water to take this medication. Leave the tablet in the sealed pack until you are ready to take it. With dry hands, open the pack and gently remove the tablet. If the tablet breaks or crumbles, throw it away. Use a new tablet. Place the tablet on the tongue and allow it to dissolve. Then, swallow it. Do not cut, crush, or chew this medication. Do not use it more often than directed. Talk to your care team about the use of this medication in children. While it may be prescribed for children as young as 6 years for selected conditions, precautions do  apply. Overdosage: If you think you have taken too much of this medicine contact a poison control center or emergency room at once. NOTE: This medicine is only for you. Do not share this medicine with others. What if I miss a dose? This does not apply. This medication is not for regular use. What may interact with this medication? Do not take this medication with any of the following: Ergot alkaloids, such as dihydroergotamine, ergotamine MAOIs, such as Marplan, Nardil, Parnate Other medications for migraine headache, such as almotriptan, eletriptan, frovatriptan, naratriptan, sumatriptan, zolmitriptan This medication may also interact with the following: Certain medications for depression, anxiety, or other mental health conditions Propranolol This list may not describe all possible interactions. Give your health care provider a list of all the medicines, herbs, non-prescription drugs, or dietary supplements you use. Also tell them if you smoke, drink alcohol, or use illegal drugs. Some items may interact with your medicine. What should I watch for while using this medication? Visit your care team for regular checks on your progress. Tell your care team if your symptoms do not start to get better or if they get worse. This medication may affect your coordination, reaction time, or judgment. Do not drive or operate machinery until you know how this medication affects you. Sit up or stand slowly to reduce the risk of dizzy or fainting spells. If you take migraine medications for 10 or more days a month, your migraines may get worse. Keep a diary of headache days and medication use. Contact your care team if your migraine attacks occur more frequently. What side effects may I notice from receiving this medication? Side effects that you should report to your care team as soon as possible: Allergic reactions--skin rash, itching, hives, swelling of the face, lips, tongue, or throat Burning, pain,  tingling, or color changes in the hands, arms, legs, or feet Heart attack--pain or tightness in the chest, shoulders, arms, or jaw, nausea, shortness of breath, cold or clammy skin, feeling faint or lightheaded Heart rhythm changes--fast or irregular heartbeat, dizziness, feeling faint or lightheaded, chest pain, trouble breathing Increase in blood pressure Irritability, confusion, fast or irregular heartbeat, muscle stiffness, twitching muscles, sweating, high fever, seizure, chills, vomiting, diarrhea, which may be signs of serotonin syndrome Raynaud syndrome--cool, numb, or painful fingers or toes that may change color from pale, to blue, to red Seizures Stroke--sudden numbness or weakness of the face, arm, or leg, trouble speaking, confusion, trouble walking, loss of balance or coordination, dizziness, severe headache, change in vision Sudden or severe stomach pain, bloody diarrhea, fever, nausea, vomiting Vision loss Side effects that usually do not require medical attention (report to your care team if they continue or are bothersome): Dizziness Unusual weakness or fatigue This list may not describe all possible side effects. Call your doctor for medical advice about side effects.  You may report side effects to FDA at 1-800-FDA-1088. Where should I keep my medication? Keep out of the reach of children and pets. Store at room temperature between 15 and 30 degrees C (59 and 86 degrees F). Protect from light and moisture. Get rid of any unused medication after the expiration date. To get rid of medications that are no longer needed or have expired: Take the medication to a medication take-back program. Check with your pharmacy or law enforcement to find a location. If you cannot return the medication, check the label or package insert to see if the medication should be thrown out in the garbage or flushed down the toilet. If you are not sure, ask your care team. If it is safe to put it in the  trash, empty the medication out of the container. Mix the medication with cat litter, dirt, coffee grounds, or other unwanted substance. Seal the mixture in a bag or container. Put it in the trash. NOTE: This sheet is a summary. It may not cover all possible information. If you have questions about this medicine, talk to your doctor, pharmacist, or health care provider.  2023 Elsevier/Gold Standard (2022-02-07 00:00:00) Ondansetron Dissolving Tablets What is this medication? ONDANSETRON (on DAN se tron) prevents nausea and vomiting from chemotherapy, radiation, or surgery. It works by blocking substances in the body that may cause nausea or vomiting. It belongs to a group of medications called antiemetics. This medicine may be used for other purposes; ask your health care provider or pharmacist if you have questions. COMMON BRAND NAME(S): Zofran ODT What should I tell my care team before I take this medication? They need to know if you have any of these conditions: Heart disease History of irregular heartbeat Liver disease Low levels of magnesium or potassium in the blood An unusual or allergic reaction to ondansetron, granisetron, other medications, foods, dyes, or preservatives Pregnant or trying to get pregnant Breast-feeding How should I use this medication? These tablets are made to dissolve in the mouth. Do not try to push the tablet through the foil backing. With dry hands, peel away the foil backing and gently remove the tablet. Place the tablet in the mouth and allow it to dissolve, then swallow. While you may take these tablets with water, it is not necessary to do so. Talk to your care team regarding the use of this medication in children. Special care may be needed. Overdosage: If you think you have taken too much of this medicine contact a poison control center or emergency room at once. NOTE: This medicine is only for you. Do not share this medicine with others. What if I miss a  dose? If you miss a dose, take it as soon as you can. If it is almost time for your next dose, take only that dose. Do not take double or extra doses. What may interact with this medication? Do not take this medication with any of the following: Apomorphine Certain medications for fungal infections like fluconazole, itraconazole, ketoconazole, posaconazole, voriconazole Cisapride Dronedarone Pimozide Thioridazine This medication may also interact with the following: Carbamazepine Certain medications for depression, anxiety, or psychotic disturbances Fentanyl Linezolid MAOIs like Carbex, Eldepryl, Marplan, Nardil, and Parnate Methylene blue (injected into a vein) Other medications that prolong the QT interval (cause an abnormal heart rhythm) like dofetilide, ziprasidone Phenytoin Rifampicin Tramadol This list may not describe all possible interactions. Give your health care provider a list of all the medicines, herbs, non-prescription drugs, or dietary supplements  you use. Also tell them if you smoke, drink alcohol, or use illegal drugs. Some items may interact with your medicine. What should I watch for while using this medication? Check with your care team as soon as you can if you have any sign of an allergic reaction. What side effects may I notice from receiving this medication? Side effects that you should report to your care team as soon as possible: Allergic reactions--skin rash, itching, hives, swelling of the face, lips, tongue, or throat Bowel blockage--stomach cramping, unable to have a bowel movement or pass gas, loss of appetite, vomiting Chest pain (angina)--pain, pressure, or tightness in the chest, neck, back, or arms Heart rhythm changes--fast or irregular heartbeat, dizziness, feeling faint or lightheaded, chest pain, trouble breathing Irritability, confusion, fast or irregular heartbeat, muscle stiffness, twitching muscles, sweating, high fever, seizure, chills,  vomiting, diarrhea, which may be signs of serotonin syndrome Side effects that usually do not require medical attention (report to your care team if they continue or are bothersome): Constipation Diarrhea General discomfort and fatigue Headache This list may not describe all possible side effects. Call your doctor for medical advice about side effects. You may report side effects to FDA at 1-800-FDA-1088. Where should I keep my medication? Keep out of the reach of children and pets. Store between 2 and 30 degrees C (36 and 86 degrees F). Throw away any unused medication after the expiration date. NOTE: This sheet is a summary. It may not cover all possible information. If you have questions about this medicine, talk to your doctor, pharmacist, or health care provider.  2023 Elsevier/Gold Standard (2007-11-28 00:00:00)  Vernell Barrier Injection What is this medication? FREMANEZUMAB (fre ma NEZ ue mab) prevents migraines. It works by blocking a substance in the body that causes migraines. It is a monoclonal antibody. This medicine may be used for other purposes; ask your health care provider or pharmacist if you have questions. COMMON BRAND NAME(S): AJOVY What should I tell my care team before I take this medication? They need to know if you have any of these conditions: An unusual or allergic reaction to fremanezumab, other medications, foods, dyes, or preservatives Pregnant or trying to get pregnant Breast-feeding How should I use this medication? This medication is injected under the skin. You will be taught how to prepare and give it. Take it as directed on the prescription label. Keep taking it unless your care team tells you to stop. It is important that you put your used needles and syringes in a special sharps container. Do not put them in a trash can. If you do not have a sharps container, call your pharmacist or care team to get one. Talk to your care team about the use of this  medication in children. Special care may be needed. Overdosage: If you think you have taken too much of this medicine contact a poison control center or emergency room at once. NOTE: This medicine is only for you. Do not share this medicine with others. What if I miss a dose? If you miss a dose, take it as soon as you can. If it is almost time for your next dose, take only that dose. Do not take double or extra doses. What may interact with this medication? Interactions are not expected. This list may not describe all possible interactions. Give your health care provider a list of all the medicines, herbs, non-prescription drugs, or dietary supplements you use. Also tell them if you smoke, drink alcohol, or  use illegal drugs. Some items may interact with your medicine. What should I watch for while using this medication? Tell your care team if your symptoms do not start to get better or if they get worse. What side effects may I notice from receiving this medication? Side effects that you should report to your care team as soon as possible: Allergic reactions or angioedema--skin rash, itching or hives, swelling of the face, eyes, lips, tongue, arms, or legs, trouble swallowing or breathing Side effects that usually do not require medical attention (report to your care team if they continue or are bothersome): Pain, redness, or irritation at injection site This list may not describe all possible side effects. Call your doctor for medical advice about side effects. You may report side effects to FDA at 1-800-FDA-1088. Where should I keep my medication? Keep out of the reach of children and pets. Store in a refrigerator or at room temperature between 20 and 25 degrees C (68 and 77 degrees F). Refrigeration (preferred): Store in the refrigerator. Do not freeze. Keep in the original container until you are ready to take it. Remove the dose from the carton about 30 minutes before it is time for you to  use it. If the dose is not used, it may be stored in the original container at room temperature for 7 days. Get rid of any unused medication after the expiration date. Room Temperature: This medication may be stored at room temperature for up to 7 days. Keep it in the original container. Protect from light until time of use. If it is stored at room temperature, get rid of any unused medication after 7 days or after it expires, whichever is first. To get rid of medications that are no longer needed or have expired: Take the medication to a medication take-back program. Check with your pharmacy or law enforcement to find a location. If you cannot return the medication, ask your pharmacist or care team how to get rid of this medication safely. NOTE: This sheet is a summary. It may not cover all possible information. If you have questions about this medicine, talk to your doctor, pharmacist, or health care provider.  2023 Elsevier/Gold Standard (2021-11-27 00:00:00)  Document Revised: 05/16/2021 Document Reviewed: 09/15/2020 Elsevier Patient Education  2023 [employer].

## 2023-02-19 NOTE — Progress Notes (Signed)
[facility]    Provider:  Dr Lucia Gaskins Requesting Provider: Erskine Emery, NP Primary Care Provider:  Lars Mage, NP  CC:  intractable headache  HPI:  Alexandra Moreno is a 32 y.o. female here as requested by Erskine Emery, NP for migraines.  Past medical history treated hep C, anxiety, IBD, vitamin D deficiency, headaches, dizziness and syncopal episodes and she is seeing cardiology soon also migraine and abnormal weight gain and elevated blood pressure reading with diagnosis of hypertension, it looks as though they have tested her thyroid, CMP and CBC on 12/30/2022 I do not have those notes.  I reviewed notes, she has a history of migraines and increased head pressure, with recent consultations with an eye doctor who ruled out glaucoma and adjusted her glasses prescription, get the headache persist, accompanying the symptoms the patient reports an increase in blood pressure concurrent with weight gain, despite attempts at various weight loss programs and medications, her heart rate increases with standing walking or exercise and she has an upcoming appointment with cardiology on April 24, symptoms started at the age of 33, she experiences daily dizziness, particularly when her heart rate is elevated, lasting from 30 seconds to a minute in the morning, the dizziness also accompanies migraines which manifest as pain behind the eyes and the forehead and at the base of the skull   She is here alone. Had migraines for over a decade, worsening, pulsating/pounding/throbbing,light/sound sensitivity, nausea, hurts to move, ringing in the ears, dizziness associated, lots of pressure in head. She recently saw ophthalmology and no mention of eye swelling but has dry eyes or problems with optic nerves. Worsening last year. Musculoskeletal neck pain all the time in the base of skull it hurts, seeing cardiology. Ringing in the ears, pressure in the head, vision changes blurred vision and keep  blinking and helps, if she bends over it makes it 10x worse, she wakes up in the morning and has a headache 2x a week especially after a nap, Morning headaches, she dozes off and can type notes and doze. Having nose bleeds. 20+ migraine days a month, no aura, no medication overuse. Moderate to severe, affecting, a hot towel may help, unknown trigggers, daily headaches and this has been ongoing for over a year at this frequency and severity. Processed foods may worsen it and taking those out. No inciting event like head trauma. Unknown family history. Can't hear out of the right ear, hearing problems, in left ear feels like underwater. Pressure all around the head a tight rubber band around the head. Especially base of the neck (points to the occipital nerve energe) behin the eyes and temples, nausea, unilaterl left can be worse but all over. No other focal neurologic deficits, associated symptoms, inciting events or modifiable factors.   Reviewed notes, labs and imaging from outside physicians, which showed:  From a thorough review of records, medications tried that can be used in migraine management include: Metoprolol, acebutelol, topirmate, amitriptyline, imitrex, maxalt  Review of Systems: Patient complains of symptoms per HPI as well as the following symptoms migraine. Pertinent negatives and positives per HPI. All others negative.   Social History   Socioeconomic History   Marital status: Single    Spouse name: Not on file   Number of children: Not on file   Years of education: Not on file   Highest education level: Not on file  Occupational History   Not on file  Tobacco Use   Smoking  status: Former    Packs/day: 1    Types: Cigarettes    Quit date: 12/28/2018    Years since quitting: 4.1    Passive exposure: Never   Smokeless tobacco: Never  Vaping Use   Vaping Use: Former   Quit date: 02/03/2023  Substance and Sexual Activity   Alcohol use: No    Comment: social before  pregnancy   Drug use: No   Sexual activity: Yes  Other Topics Concern   Not on file  Social History Narrative   Not on file   Social Determinants of Health   Financial Resource Strain: Not on file  Food Insecurity: Not on file  Transportation Needs: Not on file  Physical Activity: Not on file  Stress: Not on file  Social Connections: Not on file  Intimate Partner Violence: Not on file    Family History  Problem Relation Age of Onset   Hypertension Father    Heart disease Father    Healthy Brother    Healthy Brother    Diabetes Paternal Grandfather    Atrial fibrillation Cousin 29    Past Medical History:  Diagnosis Date   Abnormal CT of the chest 07/04/2021   Anxiety    Asthma    Calculus of gallbladder with chronic cholecystitis without obstruction 11/02/2020   Chest pain 07/04/2021   Depression    Dyspnea 07/04/2021   Ectopic pregnancy    Emphysema lung (HCC)    Sees a pulmologist currently being tested for emphysema   Gastroesophageal reflux disease without esophagitis 05/22/2022   Manic-depressive (HCC)    Mental disorder    Rectal bleeding 05/22/2022    Patient Active Problem List   Diagnosis Date Noted   Mental disorder 02/05/2023   Manic-depressive (HCC) 02/05/2023   Emphysema lung (HCC) 02/05/2023   Ectopic pregnancy 02/05/2023   Depression 02/05/2023   Asthma 02/05/2023   Anxiety 02/05/2023   Rectal bleeding 05/22/2022   Gastroesophageal reflux disease without esophagitis 05/22/2022   Abnormal CT of the chest 07/04/2021   Chest pain 07/04/2021   Dyspnea 07/04/2021   Calculus of gallbladder with chronic cholecystitis without obstruction 11/02/2020   Normal labor and delivery 12/16/2014   Late prenatal care affecting pregnancy in third trimester, antepartum 11/30/2014   Evaluate anatomy not seen on prior sonogram     Past Surgical History:  Procedure Laterality Date   ADENOIDECTOMY AND MYRINGOTOMY WITH TUBE PLACEMENT     CHOLECYSTECTOMY   12/11/2021   RH   DILATION AND CURETTAGE OF UTERUS     LAPAROSCOPY FOR ECTOPIC PREGNANCY     RADICAL HYSTERECTOMY  06/2021   RH Dr. Mel Almond from [facility], Endometriosis Complications   TUBAL LIGATION  04/2020    Current Outpatient Medications  Medication Sig Dispense Refill   acebutolol (SECTRAL) 200 MG capsule TAKE 1 CAPSULE BY MOUTH EVERY DAY 90 capsule 0   Biotin (BIOTIN 5000) 5 MG CAPS Take 1 capsule by mouth 2 (two) times daily.     COLLAGEN PO Take 1 tablet by mouth daily.     Fremanezumab-vfrm (AJOVY) 225 MG/1.5ML SOAJ Inject 225 mg into the skin every 30 (thirty) days. 1.5 mL 11   Lactobacillus (PROBIOTIC ACIDOPHILUS PO) Take 1 capsule by mouth daily.     omeprazole (PRILOSEC) 40 MG capsule Take 40 mg by mouth daily.     ondansetron (ZOFRAN-ODT) 4 MG disintegrating tablet Take 1-2 tablets (4-8 mg total) by mouth every 8 (eight) hours as needed. 30 tablet 3   rizatriptan (  MAXALT-MLT) 10 MG disintegrating tablet Take 1 tablet (10 mg total) by mouth as needed for migraine. May repeat in 2 hours if needed 9 tablet 11   Current Facility-Administered Medications  Medication Dose Route Frequency Provider Last Rate Last Admin   Fremanezumab-vfrm SOSY 225 mg  225 mg Subcutaneous Once Anson Fret, MD        Allergies as of 02/19/2023 - Review Complete 02/19/2023  Allergen Reaction Noted   Tylenol [acetaminophen] Other (See Comments) 07/08/2014    Vitals: BP 109/69   Pulse 69   Ht 5' 5.5" (1.664 m)   Wt 258 lb 6.4 oz (117.2 kg)   LMP 03/08/2014   BMI 42.35 kg/m  Last Weight:  Wt Readings from Last 1 Encounters:  02/19/23 258 lb 6.4 oz (117.2 kg)   Last Height:   Ht Readings from Last 1 Encounters:  02/19/23 5' 5.5" (1.664 m)     Physical exam: Exam: Gen: NAD, conversant, well nourised, obese, well groomed                     CV: RRR, no MRG. No Carotid Bruits. No peripheral edema, warm, nontender Eyes: Conjunctivae clear without exudates or  hemorrhage  Neuro: Detailed Neurologic Exam  Speech:    Speech is normal; fluent and spontaneous with normal comprehension.  Cognition:    The patient is oriented to person, place, and time;     recent and remote memory intact;     language fluent;     normal attention, concentration,     fund of knowledge Cranial Nerves:    The pupils are equal, round, and reactive to light. ONH elevation? Visual fields are full to finger confrontation. Extraocular movements are intact. Trigeminal sensation is intact and the muscles of mastication are normal. The face is symmetric. The palate elevates in the midline. Hearing intact. Voice is normal. Shoulder shrug is normal. The tongue has normal motion without fasciculations.   Coordination:    Normal finger to nose and heel to shin. Normal rapid alternating movements.   Gait:    Heel-toe and tandem gait are normal.   Motor Observation:    No asymmetry, no atrophy, and no involuntary movements noted. Tone:    Normal muscle tone.    Posture:    Posture is normal. normal erect    Strength:    Strength is V/V in the upper and lower limbs.      Sensation: intact to LT     Reflex Exam:  DTR's:    Deep tendon reflexes in the upper and lower extremities are normal bilaterally.   Toes:    The toes are downgoing bilaterally.   Clonus:    Clonus is absent.    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, possibly Sleep apnea   - Sleep evaluation: she wakes up in the morning headaches, she dozes off and can type notes and doze : ESS 17 - Excedrin no more than 10 days a month - see info we gave you on analgesic rebound headache - EMERGENCY:Onset headache: 10mg  Rizatriptan and 4mg  ondansetron:  repeat in 2 hours - if that doesn't work Clinical cytogeneticist me and try nurtec or ubrelvy - Prevention Ajovy - had hysterectomy once a month injection - MRI Brain w/wo contrast and MRV   - Lumbar puncture for IDIOPATHIC INTRACRANIAL HYPERTENSION - if high, treat IDIOPATHIC INTRACRANIAL HYPERTENSION and send to send ophthalmology - 12 weeks meet again - Toradol shot  today for headache   Orders Placed This Encounter  Procedures   DG FLUORO GUIDED LOC OF NEEDLE/CATH TIP FOR SPINAL INJECT LT   MR BRAIN W WO CONTRAST   MR MRV HEAD WO CM   Ambulatory referral to Sleep Studies   Meds ordered this encounter  Medications   rizatriptan (MAXALT-MLT) 10 MG disintegrating tablet    Sig: Take 1 tablet (10 mg total) by mouth as needed for migraine. May repeat in 2 hours if needed    Dispense:  9 tablet    Refill:  11   ondansetron (ZOFRAN-ODT) 4 MG disintegrating tablet    Sig: Take 1-2 tablets (4-8 mg total) by mouth every 8 (eight) hours as needed.    Dispense:  30 tablet    Refill:  3   Fremanezumab-vfrm SOSY 225 mg   Fremanezumab-vfrm (AJOVY) 225 MG/1.5ML SOAJ    Sig: Inject 225 mg into the skin every 30 (thirty) days.    Dispense:  1.5 mL    Refill:  11   ketorolac (TORADOL) injection 60 mg    [facility] [id number] [address] [id number] 10/2023    Cc: Erskine Emery, NP,  Lars Mage, NP  Naomie Dean, MD  [facility] [address] [place], [place] [address]  Phone [phone] Fax [phone]

## 2023-02-19 NOTE — Progress Notes (Signed)
Epworth Sleepiness Scale 0= would never doze 1= slight chance of dozing 2= moderate chance of dozing 3= high chance of dozing  Sitting and reading:3 Watching TV:3 Sitting inactive in a public place (ex. Theater or meeting):2 As a passenger in a car for an hour without a break:2 Lying down to rest in the afternoon:3 Sitting and talking to someone:1 Sitting quietly after lunch (no alcohol):2 In a car, while stopped in traffic:1 Total: 17  Verbal order for toradol 60mg  IM per Dr. Lucia Gaskins.  Under aseptic technique toradol 60mg  IM given L upper outer gluteal quadrant.  Pt tolerated well.  Bandaid applied. Pt has had before.

## 2023-02-22 ENCOUNTER — Encounter: Payer: Self-pay | Admitting: Neurology

## 2023-02-26 ENCOUNTER — Encounter: Payer: Self-pay | Admitting: Neurology

## 2023-02-28 ENCOUNTER — Other Ambulatory Visit: Payer: Self-pay

## 2023-02-28 ENCOUNTER — Emergency Department (HOSPITAL_BASED_OUTPATIENT_CLINIC_OR_DEPARTMENT_OTHER)
Admission: EM | Admit: 2023-02-28 | Discharge: 2023-02-28 | Disposition: A | Discharge status: Home / Self Care | Payer: Medicaid Other | Attending: Emergency Medicine | Admitting: Emergency Medicine

## 2023-02-28 DIAGNOSIS — R112 Nausea with vomiting, unspecified: Secondary | ICD-10-CM | POA: Diagnosis present

## 2023-02-28 DIAGNOSIS — R040 Epistaxis: Secondary | ICD-10-CM | POA: Diagnosis not present

## 2023-02-28 DIAGNOSIS — L299 Pruritus, unspecified: Secondary | ICD-10-CM

## 2023-02-28 DIAGNOSIS — R110 Nausea: Secondary | ICD-10-CM

## 2023-02-28 LAB — COMPREHENSIVE METABOLIC PANEL
ALT: 7 U/L (ref 0–44)
AST: 12 U/L — ABNORMAL LOW (ref 15–41)
Albumin: 4.5 g/dL (ref 3.5–5.0)
Alkaline Phosphatase: 69 U/L (ref 38–126)
Anion gap: 10 (ref 5–15)
BUN: 11 mg/dL (ref 6–20)
CO2: 26 mmol/L (ref 22–32)
Calcium: 9.9 mg/dL (ref 8.9–10.3)
Chloride: 103 mmol/L (ref 98–111)
Creatinine, Ser: 0.77 mg/dL (ref 0.44–1.00)
GFR, Estimated: >60 mL/min (ref >60)
Glucose, Bld: 119 mg/dL — ABNORMAL HIGH (ref 70–99)
Potassium: 3.5 mmol/L (ref 3.5–5.1)
Sodium: 139 mmol/L (ref 135–145)
Total Bilirubin: 0.4 mg/dL (ref 0.3–1.2)
Total Protein: 7.9 g/dL (ref 6.5–8.1)

## 2023-02-28 LAB — URINALYSIS, ROUTINE W REFLEX MICROSCOPIC
Bilirubin Urine: NEGATIVE
Glucose, UA: NEGATIVE mg/dL
Hgb urine dipstick: NEGATIVE
Ketones, ur: NEGATIVE mg/dL
Leukocytes,Ua: NEGATIVE
Nitrite: NEGATIVE
Specific Gravity, Urine: 1.03 (ref 1.005–1.030)
pH: 6 (ref 5.0–8.0)

## 2023-02-28 LAB — CBC
HCT: 36.3 % (ref 36.0–46.0)
Hemoglobin: 12.3 g/dL (ref 12.0–15.0)
MCH: 28.9 pg (ref 26.0–34.0)
MCHC: 33.9 g/dL (ref 30.0–36.0)
MCV: 85.2 fL (ref 80.0–100.0)
Platelets: 225 10*3/uL (ref 150–400)
RBC: 4.26 MIL/uL (ref 3.87–5.11)
RDW: 12.6 % (ref 11.5–15.5)
WBC: 7.6 10*3/uL (ref 4.0–10.5)
nRBC: 0 % (ref 0.0–0.2)

## 2023-02-28 LAB — LIPASE, BLOOD: Lipase: 16 U/L (ref 11–51)

## 2023-02-28 MED ORDER — PROCHLORPERAZINE MALEATE 10 MG PO TABS: 10.0000 mg | ORAL_TABLET | Freq: Two times a day (BID) | ORAL | 0 refills | Status: DC | PRN

## 2023-02-28 NOTE — ED Triage Notes (Signed)
Patient arrives with complaints of worsening emesis x1 year and intermittent nose bleeds x1 month. Patient also reports having swelling around her neck/lymph nodes intermittently as well. No pain or nose bleeds in triage.

## 2023-02-28 NOTE — ED Provider Notes (Signed)
[place] EMERGENCY DEPARTMENT AT [facility]  Provider Note  CSN: [id number] Arrival date & time: 02/28/23 4098  History Chief Complaint  Patient presents with   Emesis   Epistaxis   Fatigue    Alexandra Moreno is a 32 y.o. female here with multiple complaints which have been ongoing for months or longer. She reports a knot on her R neck, frequent nausea and vomiting, headaches, body itching and occasional nose bleeds. She has been seen by multiple doctors in various specialties as well as ED visits for similar over the last few months without definitive diagnosis.    Home Medications Prior to Admission medications   Medication Sig Start Date End Date Taking? Authorizing Provider  prochlorperazine (COMPAZINE) 10 MG tablet Take 1 tablet (10 mg total) by mouth 2 (two) times daily as needed for nausea or vomiting. 02/28/23  Yes Pollyann Savoy, MD  acebutolol (SECTRAL) 200 MG capsule TAKE 1 CAPSULE BY MOUTH EVERY DAY 01/30/23   Baldo Daub, MD  Biotin (BIOTIN 5000) 5 MG CAPS Take 1 capsule by mouth 2 (two) times daily.    [provider]  COLLAGEN PO Take 1 tablet by mouth daily.    [provider]  Fremanezumab-vfrm (AJOVY) 225 MG/1.5ML SOAJ Inject 225 mg into the skin every 30 (thirty) days. 02/19/23   Anson Fret, MD  Lactobacillus (PROBIOTIC ACIDOPHILUS PO) Take 1 capsule by mouth daily.    [provider]  omeprazole (PRILOSEC) 40 MG capsule Take 40 mg by mouth daily.    [provider]  ondansetron (ZOFRAN-ODT) 4 MG disintegrating tablet Take 1-2 tablets (4-8 mg total) by mouth every 8 (eight) hours as needed. 02/19/23   Anson Fret, MD  rizatriptan (MAXALT-MLT) 10 MG disintegrating tablet Take 1 tablet (10 mg total) by mouth as needed for migraine. May repeat in 2 hours if needed 02/19/23   Anson Fret, MD     Allergies    Tylenol [acetaminophen]   Review of Systems   Review of Systems Please see HPI for pertinent  positives and negatives  Physical Exam BP 108/67 (BP Location: Left Arm)   Pulse 67   Temp 98.1 F (36.7 C) (Oral)   Resp 18   Ht 5\' 5"  (1.651 m)   Wt 113.4 kg   LMP 03/08/2014   SpO2 100%   BMI 41.60 kg/m   Physical Exam Vitals and nursing note reviewed.  Constitutional:      Appearance: Normal appearance.  HENT:     Head: Normocephalic and atraumatic.     Nose: Nose normal.     Comments: No bleeding or signs of recent nose bleed    Mouth/Throat:     Mouth: Mucous membranes are moist.  Eyes:     Extraocular Movements: Extraocular movements intact.     Conjunctiva/sclera: Conjunctivae normal.  Cardiovascular:     Rate and Rhythm: Normal rate.  Pulmonary:     Effort: Pulmonary effort is normal.     Breath sounds: Normal breath sounds.  Abdominal:     General: Abdomen is flat.     Palpations: Abdomen is soft.     Tenderness: There is no abdominal tenderness.  Musculoskeletal:        General: No swelling. Normal range of motion.     Cervical back: Neck supple.  Lymphadenopathy:     Cervical: No cervical adenopathy.  Skin:    General: Skin is warm and dry.  Neurological:  General: No focal deficit present.     Mental Status: She is alert.  Psychiatric:        Mood and Affect: Mood normal.     ED Results / Procedures / Treatments   EKG None  Procedures Procedures  Medications Ordered in the ED Medications - No data to display  Initial Impression and Plan  Patient here with various seemingly unrelated complaints, all of which are chronic in nature. Her vitals and exam are reassuring. Labs done in triage show normal CBC, CMP, Lipase and UA. She was reassured no emergent or life threatening cause of her symptoms is identified tonight. She would like to try a different antiemetic as Zofran ODT she has been using at home has been ineffective. Plan discharge with Rx for Compazine, recommend continued outpatient evaluation for her symptoms.   ED Course        MDM Rules/Calculators/A&P Medical Decision Making Problems Addressed: Chronic nausea: chronic illness or injury Epistaxis: chronic illness or injury Pruritus: chronic illness or injury  Amount and/or Complexity of Data Reviewed Labs: ordered. Decision-making details documented in ED Course.  Risk Prescription drug management.     Final Clinical Impression(s) / ED Diagnoses Final diagnoses:  Chronic nausea  Epistaxis  Pruritus    Rx / DC Orders ED Discharge Orders          Ordered    prochlorperazine (COMPAZINE) 10 MG tablet  2 times daily PRN        02/28/23 2317             Pollyann Savoy, MD 02/28/23 2317

## 2023-03-03 ENCOUNTER — Telehealth: Payer: Self-pay | Admitting: Neurology

## 2023-03-03 NOTE — Telephone Encounter (Signed)
[employer] medicaid Berkley Harvey: [id number] exp. 02/24/2023-03/26/2023 sent to GI [phone]

## 2023-03-18 IMAGING — CT CT ANGIO CHEST
2 of 7 series · 18 of 46 positions shown · IV contrast (omnipaque)
Comparison: 03/02/2014.

CLINICAL DATA: Pt reports intermittent "L lung pain" x 10 years. Pt

EXAM:
CT ANGIOGRAPHY CHEST WITH CONTRAST
TECHNIQUE: Multidetector CT imaging of the chest was performed using the
standard protocol during bolus administration of intravenous
contrast. Multiplanar CT image reconstructions and MIPs were
obtained to evaluate the vascular anatomy.
CONTRAST:  75mL OMNIPAQUE IOHEXOL 350 MG/ML SOLN

[Series 7: thins · axial · 0.90mm/px · z∈[+1117,+1392]mm · 15 of 309 slices shown]
[im 17/309  lung]
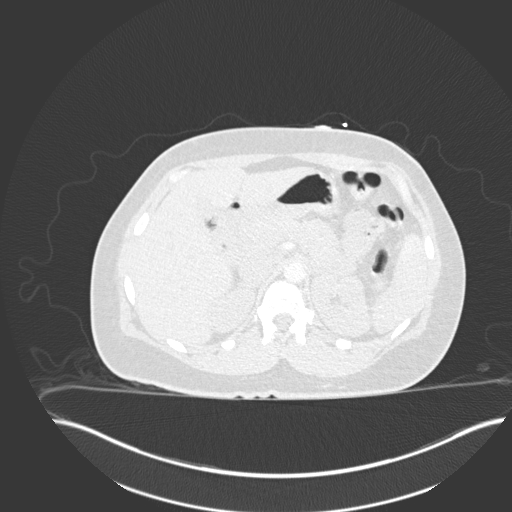
[im 33/309  soft-tissue]
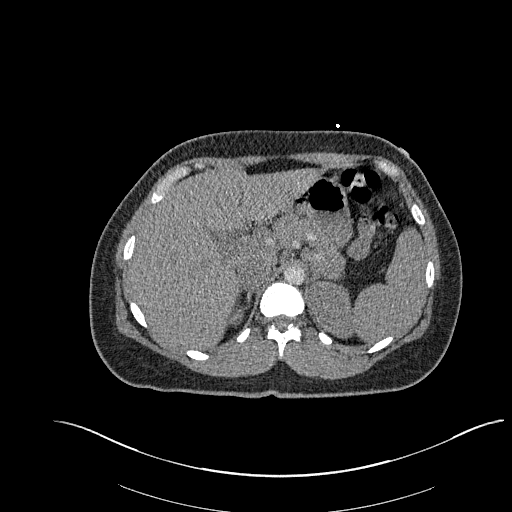
[im 65/309  lung]
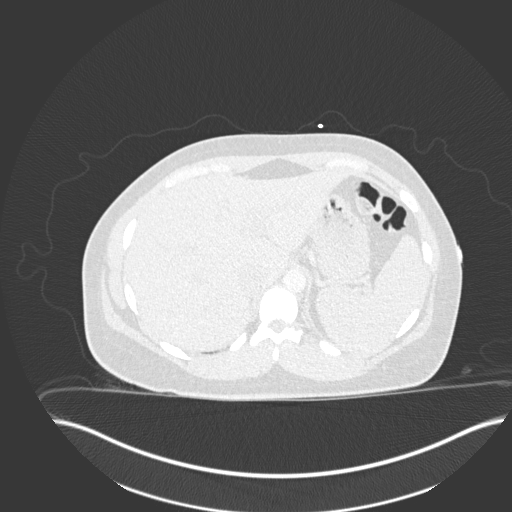
[im 82/309  soft-tissue]
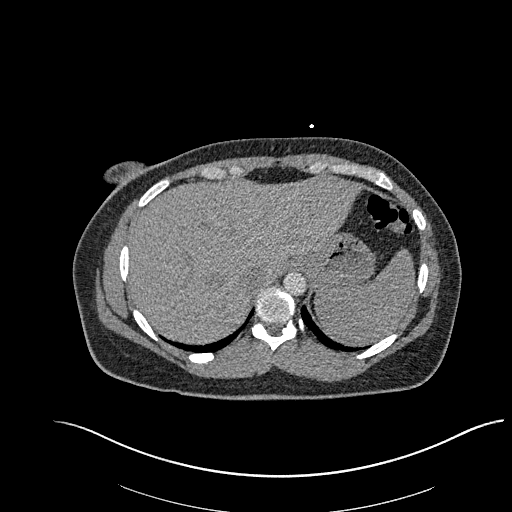
[im 98/309  lung]
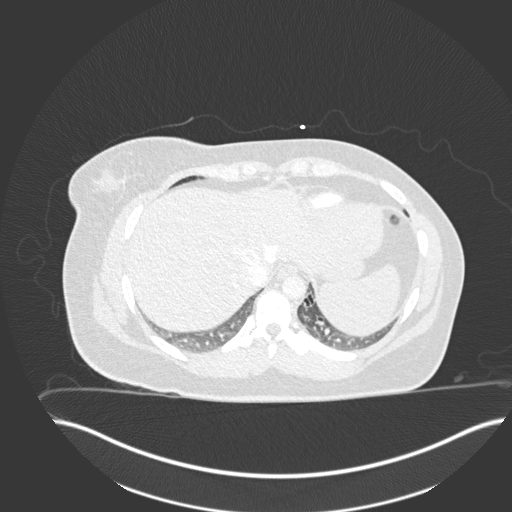
[im 114/309  soft-tissue]
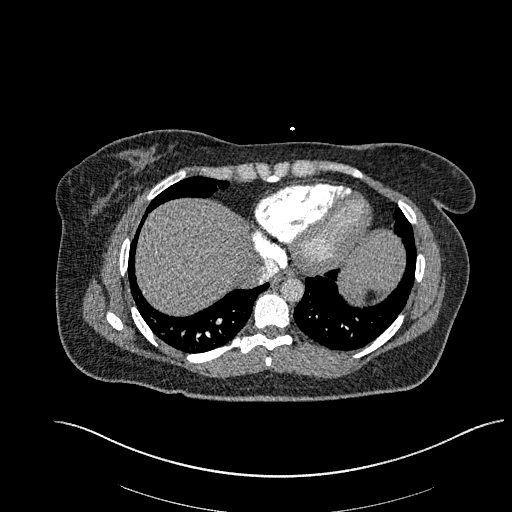
[im 130/309  lung]
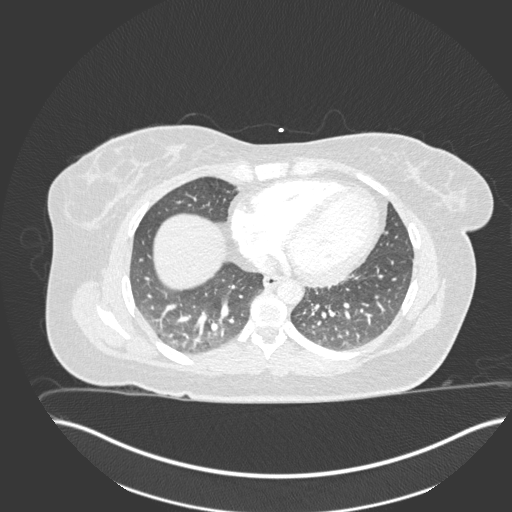
[im 163/309  soft-tissue]
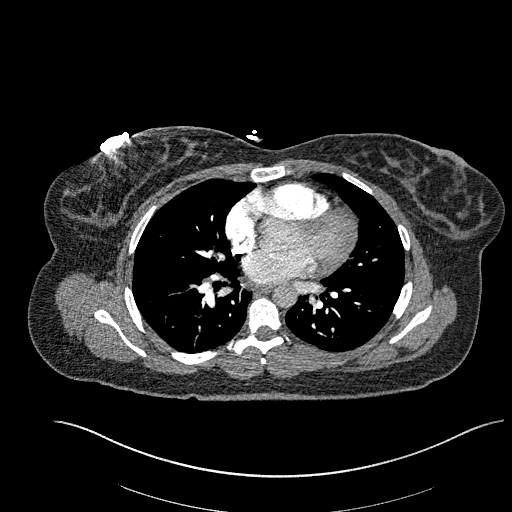
[im 179/309  lung]
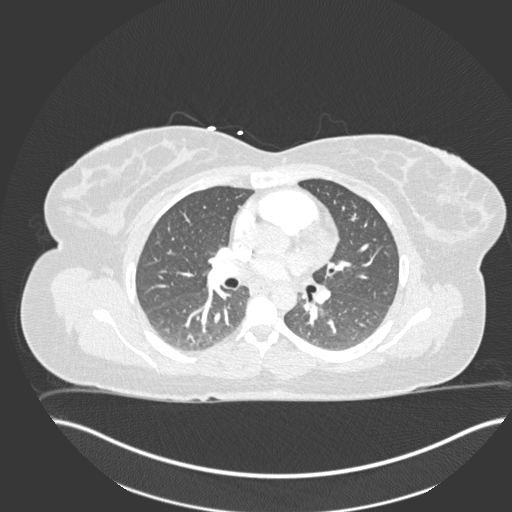
[im 195/309  soft-tissue]
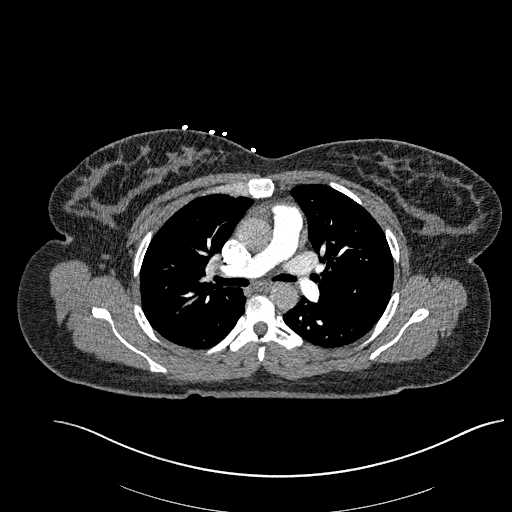
[im 211/309  lung]
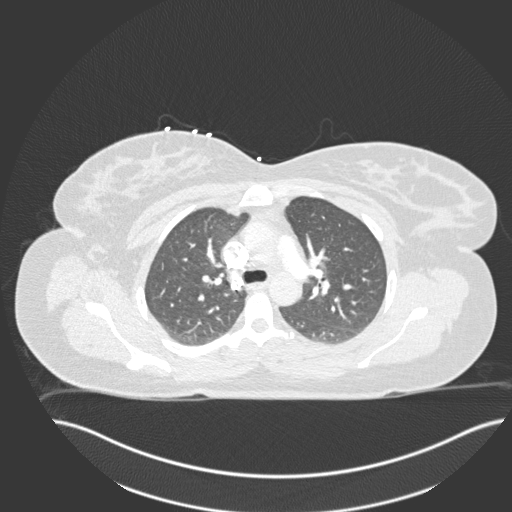
[im 227/309  soft-tissue]
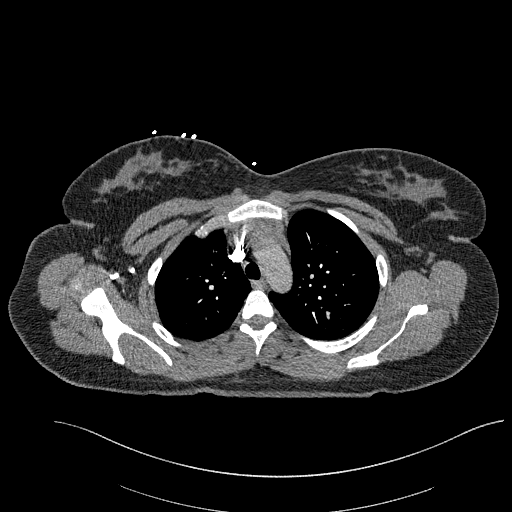
[im 260/309  lung]
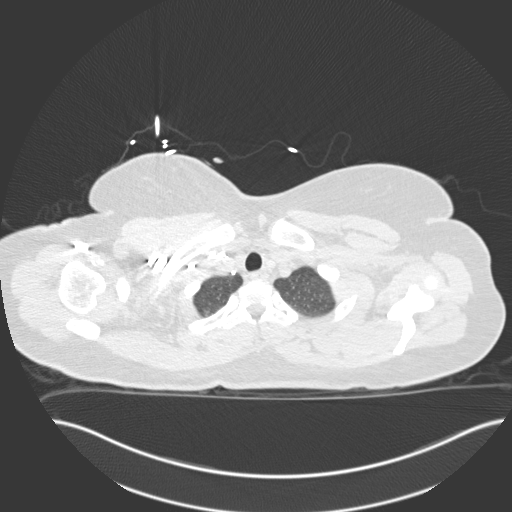
[im 276/309  soft-tissue]
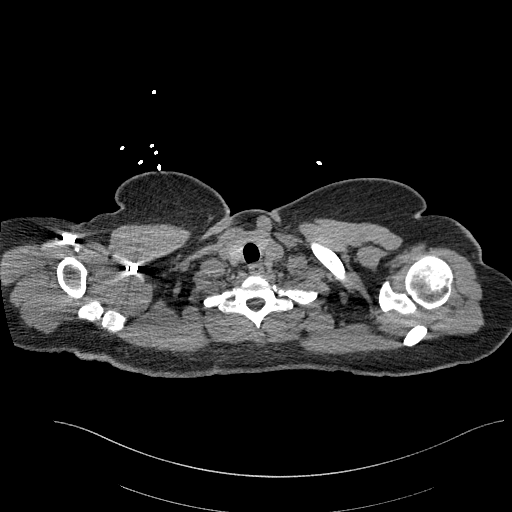
[im 292/309  lung]
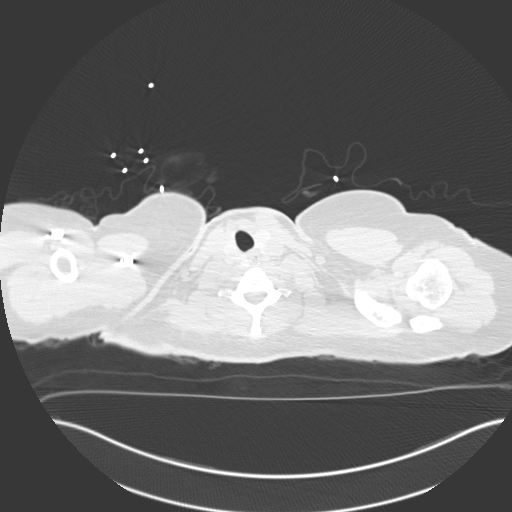

[Series 9: coronal mpr · coronal · 0.64mm/px · 3 of 123 slices shown]
[im 31/123  soft-tissue]
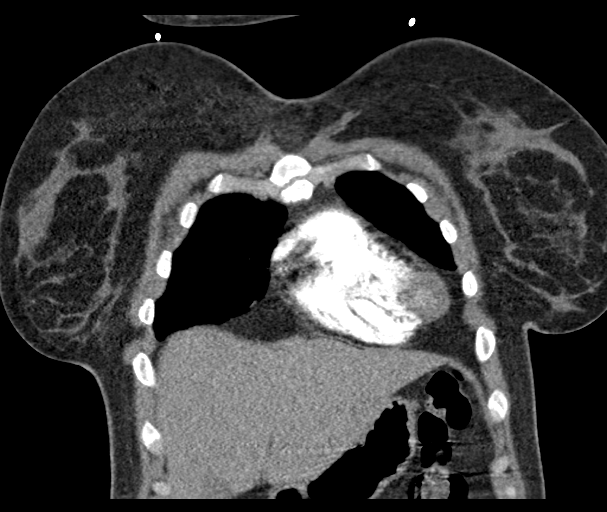
[im 62/123  soft-tissue]
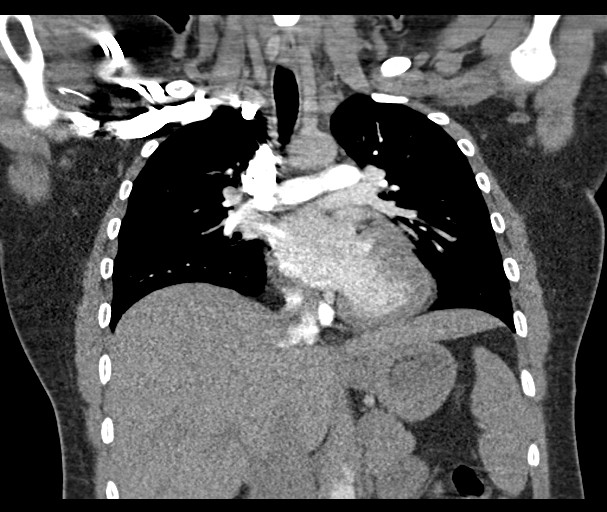
[im 92/123  soft-tissue]
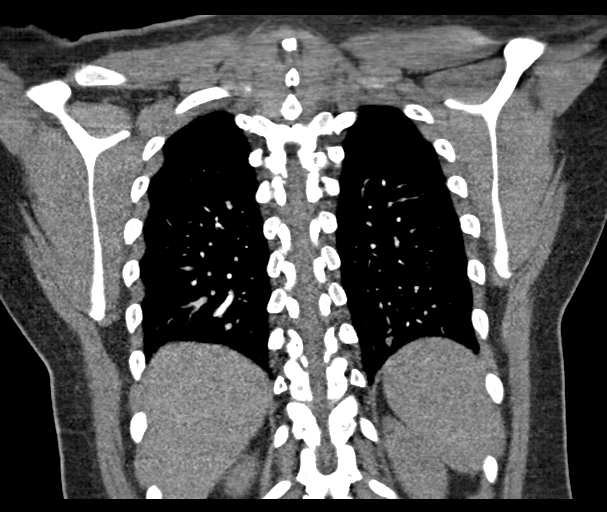

[18 of 46 positions shown; findings below may reference images not displayed]

FINDINGS: Cardiovascular: Pulmonary arteries are well opacified. No evidence
of a pulmonary embolism.

Normal heart. No pericardial effusion. Great vessels are normal in
caliber. Aorta is not opacified.

Mediastinum/Nodes: No enlarged mediastinal, hilar, or axillary lymph
nodes. Thyroid gland, trachea, and esophagus demonstrate no
significant findings.

Lungs/Pleura: Small area of cystic change, likely focal
bronchiectasis, in the posteromedial left lower lobe. This is the
location of a pneumonia noted on the prior CT. Lungs otherwise
clear. No pleural effusion or pneumothorax.

Upper Abdomen: Gallstones. No acute findings. No other abnormalities
in the visualized upper abdomen.

Musculoskeletal: No chest wall abnormality. No acute or significant
osseous findings.

Review of the MIP images confirms the above findings.
IMPRESSION: 1. No evidence of a pulmonary embolism.
2. No acute findings.
3. Gallstones.

## 2023-03-18 IMAGING — CR DG CHEST 2V
2 series · 2 of 2 positions shown · non-contrast
Comparison: 04/19/2021

CLINICAL DATA: Shortness of breath and chest pain.

EXAM:
CHEST - 2 VIEW

[chest pa]
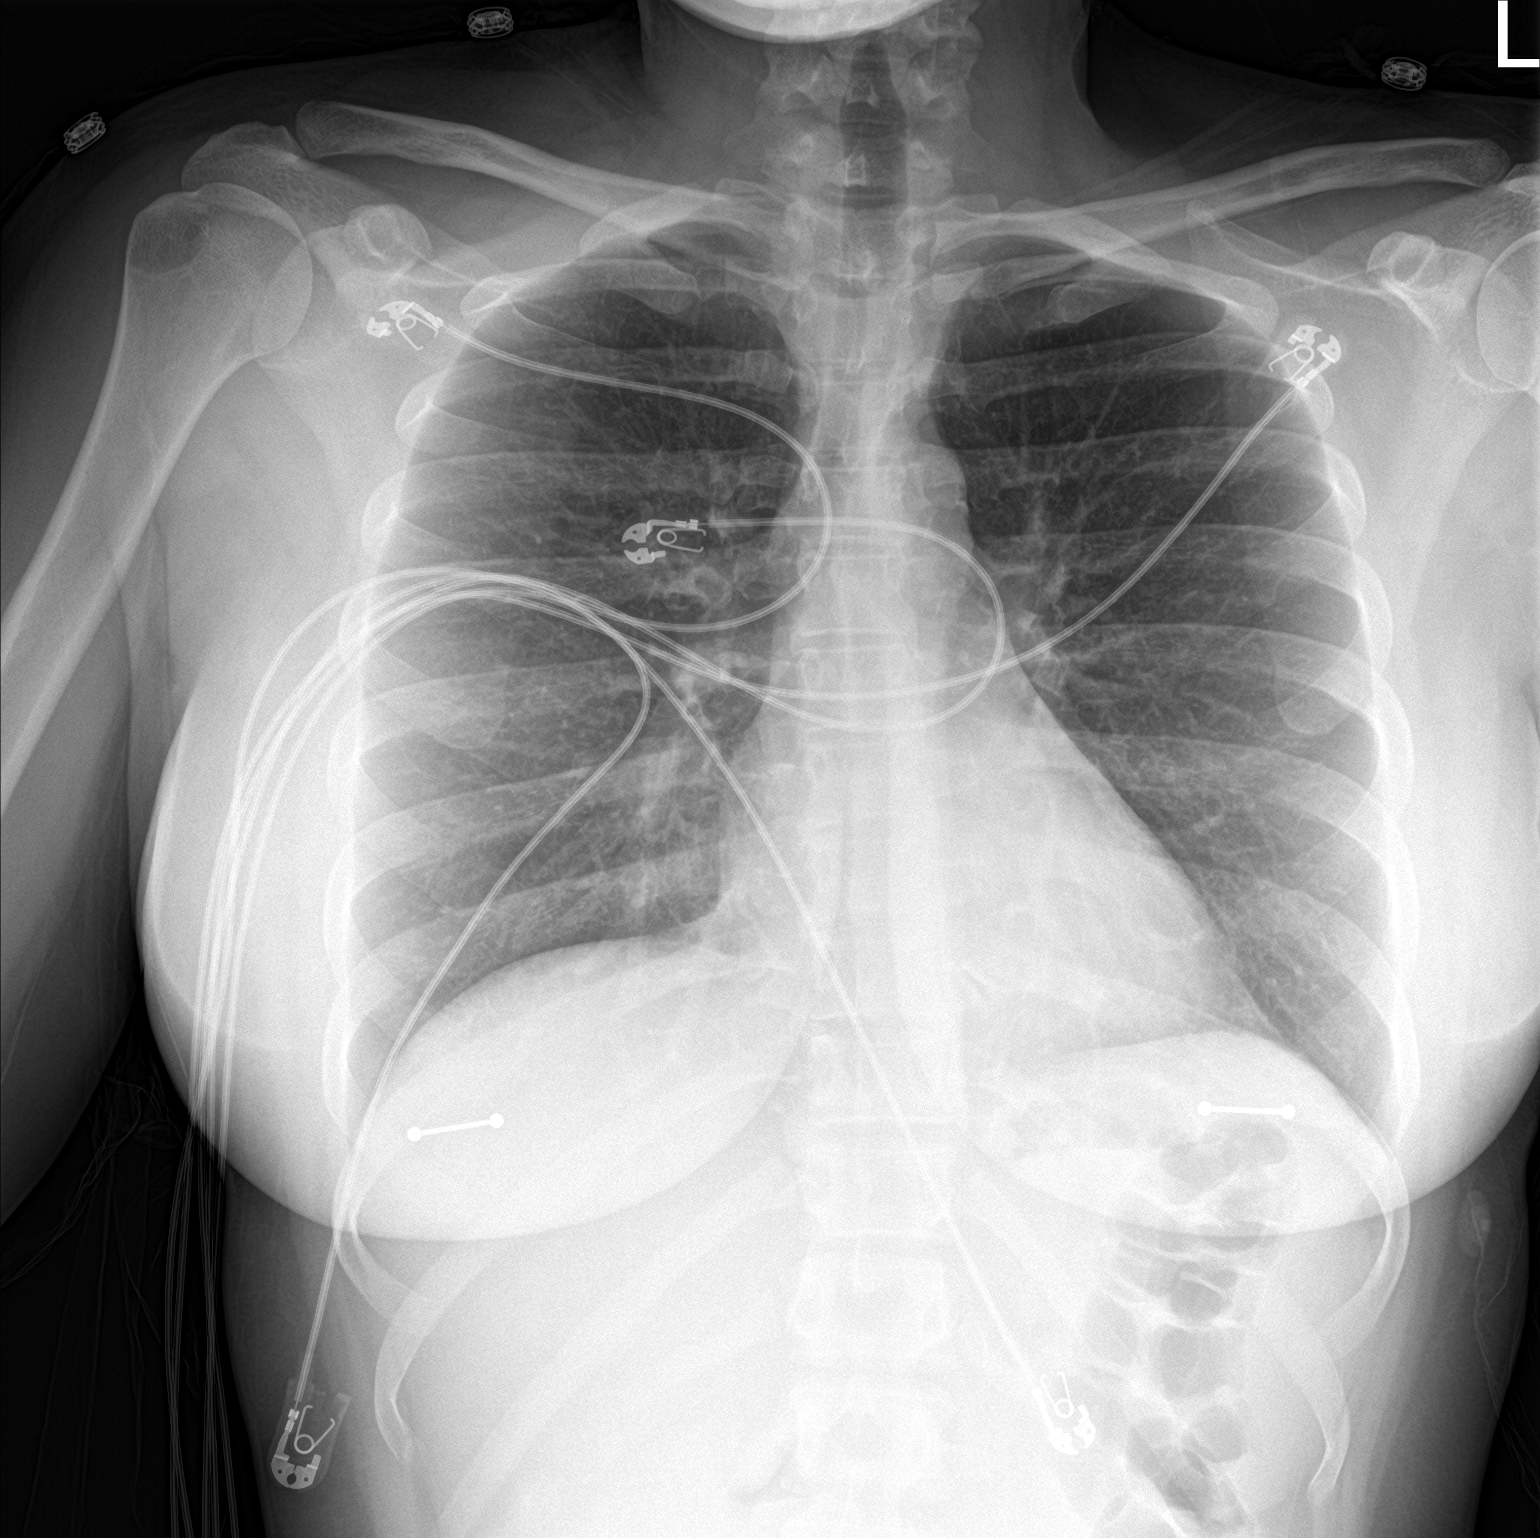

[chest lat]
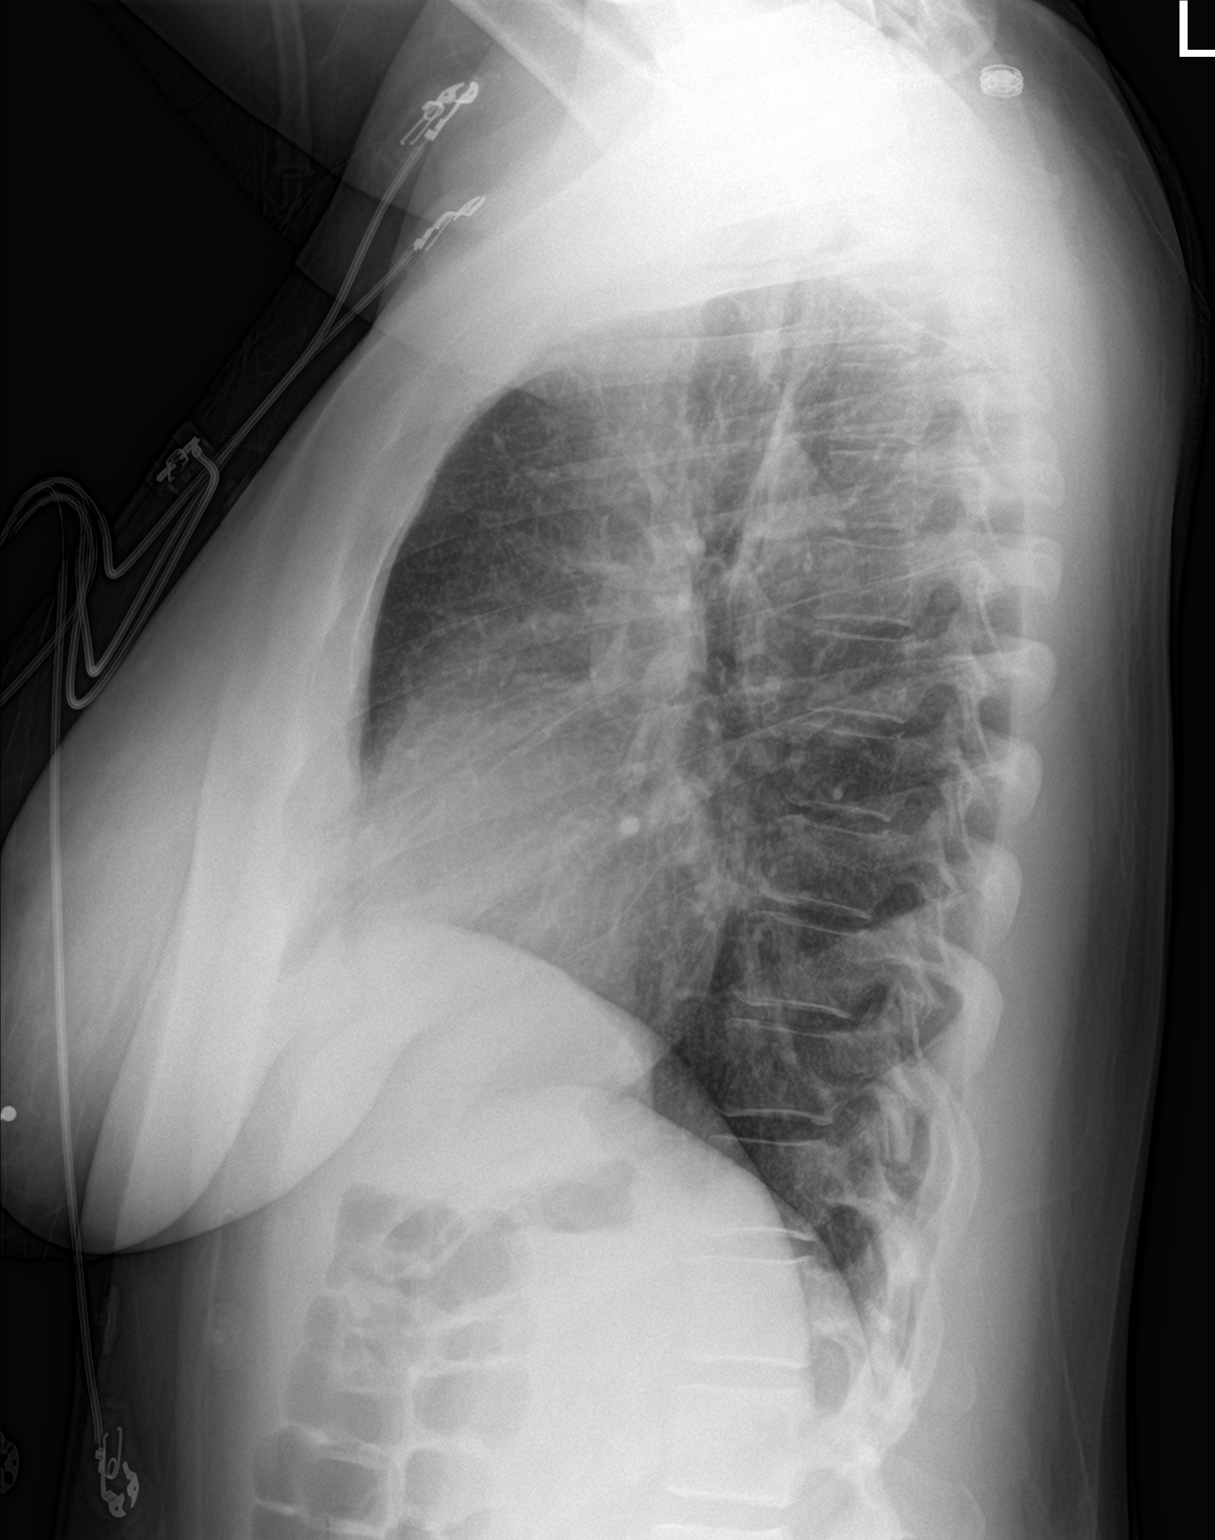

[2 of 2 positions shown; findings below may reference images not displayed]

FINDINGS: The heart size and mediastinal contours are within normal limits.
Both lungs are clear. The visualized skeletal structures are
unremarkable.
IMPRESSION: No active cardiopulmonary disease.

## 2023-03-20 ENCOUNTER — Telehealth: Payer: Self-pay

## 2023-03-20 ENCOUNTER — Other Ambulatory Visit (HOSPITAL_COMMUNITY): Payer: Self-pay

## 2023-03-20 NOTE — Telephone Encounter (Signed)
Prior auth for Acebutolol sent to our prior auth team

## 2023-03-24 ENCOUNTER — Other Ambulatory Visit (HOSPITAL_COMMUNITY): Payer: Self-pay

## 2023-03-24 NOTE — Telephone Encounter (Signed)
Per previous note patient pays out of pocket for med.   "  Called patient and informed her that her Acebutolol was not covered by her medicaid plan until she fails the metoprolol that she was prescribed by her PCP. Patient stated that she only paid $11 for her last prescription of Acebutolol and she was comfortable paying out of pocket for the medication. Patient had no further questions at this time.    "

## 2023-04-08 NOTE — Telephone Encounter (Signed)
She got scheduled at GI for June 20. I submitted a new auth with [employer] and had to fax the office notes. Tracking [phone]

## 2023-04-10 ENCOUNTER — Other Ambulatory Visit: Payer: Medicaid Other

## 2023-04-11 ENCOUNTER — Encounter: Payer: Self-pay | Admitting: Oncology

## 2023-04-11 ENCOUNTER — Other Ambulatory Visit: Payer: Self-pay | Admitting: Oncology

## 2023-04-11 ENCOUNTER — Inpatient Hospital Stay: Payer: Medicaid Other

## 2023-04-11 ENCOUNTER — Inpatient Hospital Stay: Payer: Medicaid Other | Attending: Oncology | Admitting: Oncology

## 2023-04-11 VITALS — BP 142/90 | HR 71 | Temp 99.0°F | Resp 16 | Ht 65.0 in | Wt 244.4 lb

## 2023-04-11 DIAGNOSIS — Z8619 Personal history of other infectious and parasitic diseases: Secondary | ICD-10-CM | POA: Diagnosis not present

## 2023-04-11 DIAGNOSIS — R591 Generalized enlarged lymph nodes: Secondary | ICD-10-CM

## 2023-04-11 DIAGNOSIS — F17290 Nicotine dependence, other tobacco product, uncomplicated: Secondary | ICD-10-CM | POA: Diagnosis not present

## 2023-04-11 DIAGNOSIS — R5383 Other fatigue: Secondary | ICD-10-CM | POA: Diagnosis not present

## 2023-04-11 LAB — HEPATITIS A ANTIBODY, TOTAL: hep A Total Ab: NONREACTIVE

## 2023-04-11 LAB — CBC WITH DIFFERENTIAL (CANCER CENTER ONLY)
Abs Immature Granulocytes: 0.01 10*3/uL (ref 0.00–0.07)
Basophils Absolute: 0 10*3/uL (ref 0.0–0.1)
Basophils Relative: 0 %
Eosinophils Absolute: 0 10*3/uL (ref 0.0–0.5)
Eosinophils Relative: 0 %
HCT: 36.2 % (ref 36.0–46.0)
Hemoglobin: 12 g/dL (ref 12.0–15.0)
Immature Granulocytes: 0 %
Lymphocytes Relative: 28 %
Lymphs Abs: 1.9 10*3/uL (ref 0.7–4.0)
MCH: 28.4 pg (ref 26.0–34.0)
MCHC: 33.1 g/dL (ref 30.0–36.0)
MCV: 85.8 fL (ref 80.0–100.0)
Monocytes Absolute: 0.5 10*3/uL (ref 0.1–1.0)
Monocytes Relative: 7 %
Neutro Abs: 4.3 10*3/uL (ref 1.7–7.7)
Neutrophils Relative %: 65 %
Platelet Count: 224 10*3/uL (ref 150–400)
RBC: 4.22 MIL/uL (ref 3.87–5.11)
RDW: 12.7 % (ref 11.5–15.5)
WBC Count: 6.7 10*3/uL (ref 4.0–10.5)
nRBC: 0 % (ref 0.0–0.2)

## 2023-04-11 LAB — CMP (CANCER CENTER ONLY)
ALT: 16 U/L (ref 0–44)
AST: 16 U/L (ref 15–41)
Albumin: 4.2 g/dL (ref 3.5–5.0)
Alkaline Phosphatase: 50 U/L (ref 38–126)
Anion gap: 8 (ref 5–15)
BUN: 14 mg/dL (ref 6–20)
CO2: 23 mmol/L (ref 22–32)
Calcium: 9.2 mg/dL (ref 8.9–10.3)
Chloride: 107 mmol/L (ref 98–111)
Creatinine: 0.7 mg/dL (ref 0.44–1.00)
GFR, Estimated: >60 mL/min (ref >60)
Glucose, Bld: 84 mg/dL (ref 70–99)
Potassium: 3.4 mmol/L — ABNORMAL LOW (ref 3.5–5.1)
Sodium: 138 mmol/L (ref 135–145)
Total Bilirubin: 0.6 mg/dL (ref 0.3–1.2)
Total Protein: 7.8 g/dL (ref 6.5–8.1)

## 2023-04-11 LAB — HEPATITIS B SURFACE ANTIBODY,QUALITATIVE: Hep B S Ab: REACTIVE — AB

## 2023-04-11 LAB — LACTATE DEHYDROGENASE: LDH: 132 U/L (ref 98–192)

## 2023-04-11 LAB — HEPATITIS B SURFACE ANTIGEN: Hepatitis B Surface Ag: NONREACTIVE

## 2023-04-11 LAB — HEPATITIS B CORE ANTIBODY, TOTAL: Hep B Core Total Ab: NONREACTIVE

## 2023-04-11 LAB — SEDIMENTATION RATE: Sed Rate: 14 mm/hr (ref 0–22)

## 2023-04-16 ENCOUNTER — Telehealth: Payer: Self-pay

## 2023-04-16 NOTE — Telephone Encounter (Signed)
Pt LVM on nurse line stating, "I saw my results in MyChart from last Friday. 1 of the hepatitis test was reactive, the others were non reactive. Could someone explain this to me"?

## 2023-04-16 NOTE — Telephone Encounter (Signed)
[employer] medicaid Berkley Harvey: [id number] exp. 04/08/2023-05/08/2023

## 2023-04-18 DIAGNOSIS — R591 Generalized enlarged lymph nodes: Secondary | ICD-10-CM | POA: Insufficient documentation

## 2023-04-18 NOTE — Progress Notes (Signed)
[facility]  [address] [place],  [place]  [address] [phone]  Clinic Day:  04/11/2023  Referring physician: Lars Mage, NP   HISTORY OF PRESENT ILLNESS:  The patient is a 32 y.o. female  who I was asked to consult upon for questionable lymphadenopathy in her cervical/supraclavicular regions.  The patient claims she has felt nodes in these regions for months.  However, this patient had a neck CT in November 2022, for which no abnormal lymphadenopathy was seen.  She has been more fatigued over these past months.  She also complains of pruritus that has been present for the past 3 months.  However, she denies having fevers or drenching night sweats.  She does complain of nausea and claims to have lost 20 pounds over the past few weeks.  Despite this, a recent KUB in May 2024 showed no abnormalities.  With respect to her subjective lymphadenopathy, the patient's main concern is she may have a systemic viral illness, such as HIV.  However, HIV testing in March 2024 came back negative.  She admits to having hepatitis C in the past, but has already been treated for it.  She claims her hepatitis C viral load is now nonexistent.    PAST MEDICAL HISTORY:   Past Medical History:  Diagnosis Date   Abnormal CT of the chest 07/04/2021   Anxiety    Asthma    Calculus of gallbladder with chronic cholecystitis without obstruction 11/02/2020   Chest pain 07/04/2021   Depression    Dyspnea 07/04/2021   Ectopic pregnancy    Emphysema lung (HCC)    Sees a pulmologist currently being tested for emphysema   Gastroesophageal reflux disease without esophagitis 05/22/2022   Hypertension    Manic-depressive (HCC)    Mental disorder    Rectal bleeding 05/22/2022    PAST SURGICAL HISTORY:   Past Surgical History:  Procedure Laterality Date   ADENOIDECTOMY AND MYRINGOTOMY WITH TUBE PLACEMENT     CHOLECYSTECTOMY  12/11/2021   RH   DILATION AND CURETTAGE OF  UTERUS     LAPAROSCOPY FOR ECTOPIC PREGNANCY     RADICAL HYSTERECTOMY  06/2021   RH Dr. Mel Almond from [facility], Endometriosis Complications   TUBAL LIGATION  04/2020    CURRENT MEDICATIONS:   Current Outpatient Medications  Medication Sig Dispense Refill   acebutolol (SECTRAL) 200 MG capsule TAKE 1 CAPSULE BY MOUTH EVERY DAY 90 capsule 0   COLLAGEN PO Take 1 tablet by mouth daily.     Current Facility-Administered Medications  Medication Dose Route Frequency Provider Last Rate Last Admin   Fremanezumab-vfrm SOSY 225 mg  225 mg Subcutaneous Once Anson Fret, MD        ALLERGIES:   Allergies  Allergen Reactions   Tylenol [Acetaminophen] Other (See Comments)    "Jittery, lethargic"    FAMILY HISTORY:   Family History  Problem Relation Age of Onset   Hypertension Father    Heart disease Father    Healthy Brother    Healthy Brother    Bone cancer Maternal Uncle    Diabetes Paternal Grandfather    Atrial fibrillation Cousin 29    SOCIAL HISTORY:  The patient was born and raised in [place].  She lives in [place].  She is separated, with 4 children.  She works as a Sales executive.  She quit smoking 4 years ago, but admits to vaping.  She drinks alcohol socially.    REVIEW OF SYSTEMS:  Review of Systems  Constitutional:  Positive for fatigue and unexpected weight change. Negative for fever.  HENT:   Negative for hearing loss and sore throat.   Eyes:  Negative for eye problems.  Respiratory:  Positive for cough. Negative for chest tightness and hemoptysis.   Cardiovascular:  Positive for palpitations. Negative for chest pain.  Gastrointestinal:  Positive for abdominal pain, constipation and nausea. Negative for abdominal distention, blood in stool, diarrhea and vomiting.  Endocrine: Negative for hot flashes.  Genitourinary:  Negative for difficulty urinating, dysuria, frequency, hematuria and nocturia.   Musculoskeletal:  Positive for arthralgias. Negative for back  pain, gait problem and myalgias.  Skin:  Positive for itching. Negative for rash.  Neurological:  Positive for headaches. Negative for dizziness, extremity weakness, gait problem, light-headedness and numbness.  Hematological: Negative.   Psychiatric/Behavioral: Negative.  Negative for depression and suicidal ideas. The patient is not nervous/anxious.      PHYSICAL EXAM:  Blood pressure (!) 142/90, pulse 71, temperature 99 F (37.2 C), resp. rate 16, height 5\' 5"  (1.651 m), weight 244 lb 6.4 oz (110.9 kg), last menstrual period 03/08/2014, SpO2 100 %, unknown if currently breastfeeding. Wt Readings from Last 3 Encounters:  04/11/23 244 lb 6.4 oz (110.9 kg)  02/28/23 250 lb (113.4 kg)  02/19/23 258 lb 6.4 oz (117.2 kg)   Body mass index is 40.67 kg/m. Performance status (ECOG): 0 - Asymptomatic Physical Exam Constitutional:      Appearance: Normal appearance. She is not ill-appearing.  HENT:     Mouth/Throat:     Mouth: Mucous membranes are moist.     Pharynx: Oropharynx is clear. No oropharyngeal exudate or posterior oropharyngeal erythema.  Cardiovascular:     Rate and Rhythm: Normal rate and regular rhythm.     Heart sounds: No murmur heard.    No friction rub. No gallop.  Pulmonary:     Effort: Pulmonary effort is normal. No respiratory distress.     Breath sounds: Normal breath sounds. No wheezing, rhonchi or rales.  Abdominal:     General: Bowel sounds are normal. There is no distension.     Palpations: Abdomen is soft. There is no mass.     Tenderness: There is no abdominal tenderness.  Musculoskeletal:        General: No swelling.     Right lower leg: No edema.     Left lower leg: No edema.  Lymphadenopathy:     Cervical: No cervical adenopathy.     Upper Body:     Right upper body: No supraclavicular or axillary adenopathy.     Left upper body: No supraclavicular or axillary adenopathy.     Lower Body: No right inguinal adenopathy. No left inguinal adenopathy.   Skin:    General: Skin is warm.     Coloration: Skin is not jaundiced.     Findings: No lesion or rash.  Neurological:     General: No focal deficit present.     Mental Status: She is alert and oriented to person, place, and time. Mental status is at baseline.  Psychiatric:        Mood and Affect: Mood normal.        Behavior: Behavior normal.        Thought Content: Thought content normal.    LABS:      Latest Ref Rng & Units 04/11/2023    2:28 PM 02/28/2023    7:28 PM 06/28/2022    6:32 PM  CBC  WBC 4.0 - 10.5 K/uL 6.7  7.6  5.0   Hemoglobin 12.0 - 15.0 g/dL 40.9  81.1  91.4   Hematocrit 36.0 - 46.0 % 36.2  36.3  36.8   Platelets 150 - 400 K/uL 224  225  223       Latest Ref Rng & Units 04/11/2023    2:28 PM 02/28/2023    7:28 PM 06/28/2022    6:32 PM  CMP  Glucose 70 - 99 mg/dL 84  782  956   BUN 6 - 20 mg/dL 14  11  11    Creatinine 0.44 - 1.00 mg/dL 2.13  0.86  5.78   Sodium 135 - 145 mmol/L 138  139  139   Potassium 3.5 - 5.1 mmol/L 3.4  3.5  3.5   Chloride 98 - 111 mmol/L 107  103  107   CO2 22 - 32 mmol/L 23  26  25    Calcium 8.9 - 10.3 mg/dL 9.2  9.9  9.6   Total Protein 6.5 - 8.1 g/dL 7.8  7.9    Total Bilirubin 0.3 - 1.2 mg/dL 0.6  0.4    Alkaline Phos 38 - 126 U/L 50  69    AST 15 - 41 U/L 16  12    ALT 0 - 44 U/L 16  7     Lab Results  Component Value Date   LDH 132 04/11/2023    Latest Reference Range & Units Most Recent  Hep A Ab, Total NON REACTIVE  NON REACTIVE 04/11/23 14:28  Hepatitis B Surface Ag NON REACTIVE  NON REACTIVE 04/11/23 14:27  Hep B S Ab NON REACTIVE  Reactive ! 04/11/23 14:28  Hep B Core Total Ab NON REACTIVE  NON REACTIVE 04/11/23 14:28  !: Data is abnormal  ASSESSMENT & PLAN:  A 32 y.o. female who I was asked to consult upon for questionable cervical/supraclavicular lymphadenopathy.  I did not feel any suspicious lymphadenopathy on her exam today that had me worried about any particular systemic process being present.  Furthermore,  I am comforted by her normal LDH level.  As mentioned previously, recent HIV testing came back negative.  Furthermore, hepatitis A and B testing showed no active viral infection.  Overall, I do not get the sense any type of ominous disease process is present.  I do feel comfortable turning her care back over to her primary care office.  I would not have a problem seeing her in the future if new hematologic/oncologic issues arise that require repeat clinical assessment.  The patient understands all the plans discussed today and is in agreement with them.  I do appreciate Tetter, Devin B, NP for his new consult.   Saul Fabiano Kirby Funk, MD

## 2023-04-22 ENCOUNTER — Other Ambulatory Visit: Payer: Medicaid Other

## 2023-05-12 ENCOUNTER — Ambulatory Visit: Payer: Medicaid Other | Admitting: Neurology

## 2023-05-14 ENCOUNTER — Other Ambulatory Visit (HOSPITAL_COMMUNITY): Payer: Self-pay

## 2023-05-15 ENCOUNTER — Telehealth: Payer: Self-pay | Admitting: *Deleted

## 2023-05-15 NOTE — Telephone Encounter (Signed)
Contacted pt, no answer and VM unavailable. Alexandra Moreno sent pt MC msg.

## 2023-05-15 NOTE — Telephone Encounter (Signed)
Its my understanding that she needs to get the MRIs before an LP. Was she approved Tori? If so call her bacl and tell her she needs to call and schedule MRIs [phone] and then when those are resulted she can call to schedule LP

## 2023-05-15 NOTE — Telephone Encounter (Signed)
Pod 4, call and let her know she no showed her MRIs, we will get them approved once more and if she no shows again we will not recertify them, it takes many staff hours. After imaging, she should be able to get the LP. But if she is not wanting them(I highly encourage her to get all the testing I recommended complete), let us know so we don;t put in the labor thanks

## 2023-05-15 NOTE — Telephone Encounter (Signed)
Alexandra Moreno w/ GI messaged me and said the patient is wanting to schedule her LP but she never came for her MRI. Manus Gunning is asking if Dr Lucia Gaskins wants patient to have MRI before LP or if they can proceed with LP.

## 2023-07-03 NOTE — Telephone Encounter (Signed)
[employer] medicaid Berkley Harvey: [id number] exp. 07/02/2023-08/01/2023 sent to GI [phone]

## 2023-08-04 ENCOUNTER — Encounter: Payer: Self-pay | Admitting: Neurology

## 2023-08-10 ENCOUNTER — Ambulatory Visit
Admission: RE | Admit: 2023-08-10 | Discharge: 2023-08-10 | Disposition: A | Discharge status: Home / Self Care | Payer: Medicaid Other | Source: Ambulatory Visit | Attending: Neurology | Admitting: Neurology

## 2023-08-10 DIAGNOSIS — R42 Dizziness and giddiness: Secondary | ICD-10-CM

## 2023-08-10 DIAGNOSIS — H4710 Unspecified papilledema: Secondary | ICD-10-CM

## 2023-08-10 DIAGNOSIS — H9193 Unspecified hearing loss, bilateral: Secondary | ICD-10-CM

## 2023-08-10 DIAGNOSIS — G441 Vascular headache, not elsewhere classified: Secondary | ICD-10-CM

## 2023-08-10 DIAGNOSIS — R51 Headache with orthostatic component, not elsewhere classified: Secondary | ICD-10-CM

## 2023-08-10 DIAGNOSIS — G932 Benign intracranial hypertension: Secondary | ICD-10-CM

## 2023-08-10 DIAGNOSIS — G4719 Other hypersomnia: Secondary | ICD-10-CM

## 2023-08-10 DIAGNOSIS — R519 Headache, unspecified: Secondary | ICD-10-CM

## 2023-08-10 DIAGNOSIS — H539 Unspecified visual disturbance: Secondary | ICD-10-CM

## 2023-08-10 MED ORDER — GADOPICLENOL 0.5 MMOL/ML IV SOLN
10.0000 mL | Freq: Once | INTRAVENOUS | Status: AC | PRN
  Administered 2023-08-10: 10 mL via INTRAVENOUS

## 2023-09-09 ENCOUNTER — Encounter: Payer: Self-pay | Admitting: Neurology

## 2023-09-09 ENCOUNTER — Ambulatory Visit: Payer: Medicaid Other | Admitting: Neurology

## 2023-09-17 NOTE — Discharge Instructions (Signed)

## 2023-09-22 ENCOUNTER — Inpatient Hospital Stay
Admission: RE | Admit: 2023-09-22 | Discharge: 2023-09-22 | Disposition: A | Discharge status: Home / Self Care | Payer: Medicaid Other | Source: Ambulatory Visit | Attending: Neurology | Admitting: Neurology

## 2023-10-16 NOTE — Discharge Instructions (Addendum)

## 2023-10-17 ENCOUNTER — Inpatient Hospital Stay
Admission: RE | Admit: 2023-10-17 | Discharge: 2023-10-17 | Disposition: A | Discharge status: Home / Self Care | Payer: Medicaid Other | Source: Ambulatory Visit | Attending: Neurology | Admitting: Neurology

## 2023-10-30 NOTE — Discharge Instructions (Signed)

## 2023-10-31 ENCOUNTER — Ambulatory Visit
Admission: RE | Admit: 2023-10-31 | Discharge: 2023-10-31 | Disposition: A | Discharge status: Home / Self Care | Payer: Medicaid Other | Source: Ambulatory Visit | Attending: Neurology

## 2023-10-31 VITALS — BP 114/61 | HR 62

## 2023-10-31 DIAGNOSIS — G4719 Other hypersomnia: Secondary | ICD-10-CM

## 2023-10-31 DIAGNOSIS — G43711 Chronic migraine without aura, intractable, with status migrainosus: Secondary | ICD-10-CM

## 2023-10-31 DIAGNOSIS — R519 Headache, unspecified: Secondary | ICD-10-CM

## 2023-10-31 DIAGNOSIS — G932 Benign intracranial hypertension: Secondary | ICD-10-CM

## 2023-10-31 DIAGNOSIS — H4710 Unspecified papilledema: Secondary | ICD-10-CM

## 2023-11-13 LAB — CSF CELL COUNT WITH DIFFERENTIAL
RBC Count, CSF: 250 {cells}/uL — ABNORMAL HIGH
TOTAL NUCLEATED CELL: 0 {cells}/uL (ref 0–5)

## 2023-11-13 LAB — GLUCOSE, CSF: Glucose, CSF: 57 mg/dL (ref 40–80)

## 2023-11-13 LAB — PROTEIN, CSF: Total Protein, CSF: 28 mg/dL (ref 15–45)

## 2023-11-18 ENCOUNTER — Encounter: Payer: Self-pay | Admitting: Neurology

## 2024-04-14 ENCOUNTER — Ambulatory Visit: Admitting: Allergy and Immunology

## 2024-04-14 ENCOUNTER — Encounter: Payer: Self-pay | Admitting: Allergy and Immunology

## 2024-04-14 VITALS — BP 124/84 | HR 74 | Ht 67.0 in | Wt 284.0 lb

## 2024-04-14 DIAGNOSIS — R682 Dry mouth, unspecified: Secondary | ICD-10-CM

## 2024-04-14 DIAGNOSIS — K121 Other forms of stomatitis: Secondary | ICD-10-CM

## 2024-04-14 DIAGNOSIS — H04123 Dry eye syndrome of bilateral lacrimal glands: Secondary | ICD-10-CM

## 2024-04-14 DIAGNOSIS — R1900 Intra-abdominal and pelvic swelling, mass and lump, unspecified site: Secondary | ICD-10-CM | POA: Diagnosis not present

## 2024-04-14 DIAGNOSIS — G43909 Migraine, unspecified, not intractable, without status migrainosus: Secondary | ICD-10-CM

## 2024-04-14 DIAGNOSIS — R000 Tachycardia, unspecified: Secondary | ICD-10-CM

## 2024-04-14 DIAGNOSIS — R232 Flushing: Secondary | ICD-10-CM | POA: Diagnosis not present

## 2024-04-14 DIAGNOSIS — R5383 Other fatigue: Secondary | ICD-10-CM

## 2024-04-14 DIAGNOSIS — Z720 Tobacco use: Secondary | ICD-10-CM

## 2024-04-14 DIAGNOSIS — R400 Somnolence: Secondary | ICD-10-CM

## 2024-04-14 DIAGNOSIS — L299 Pruritus, unspecified: Secondary | ICD-10-CM | POA: Diagnosis not present

## 2024-04-14 DIAGNOSIS — M2550 Pain in unspecified joint: Secondary | ICD-10-CM

## 2024-04-14 NOTE — Patient Instructions (Addendum)
  1. Blood - tryptase, C4, SED, CRP, thyroid peroxidase ab  2. Urine - Methylhistamine, LTE4, 5HIAA, PGD2  3. Treat and prevent headaches:   A. Slowly decrease caffeine consumption and analgesic use  B. Obtain home sleep study for sleep apnea  4. Further evaluation and treatment???  5. Use oral nicotine substitutes to replace vape

## 2024-04-14 NOTE — Progress Notes (Unsigned)
 [place] - [place] - [place] - [place] - [place]   Dear Nestor,  Thank you for referring Alexandra Moreno to the [facility] [place]  on 04/14/2024.   Below is a summation of this patient's evaluation and recommendations.  Thank you for your referral. I will keep you informed about this patient's response to treatment.   If you have any questions please do not hesitate to contact me.   Sincerely,  Camellia DOROTHA Denis, MD Allergy / Immunology [place] [facility] [place]    ______________________________________________________________________    NEW PATIENT NOTE  Referring Provider: Nestor Elston NOVAK, NP Primary Provider: Silvano Angeline FALCON, NP Date of office visit: 04/14/2024    Subjective:   Chief Complaint:  Alexandra Moreno (DOB: 1990-11-18) is a 33 y.o. female who presents to the clinic on 04/14/2024 with a chief complaint of Pruritis .     HPI: Alexandra Moreno presents to this clinic in evaluation of multiple issues.  She states that she gets intermittent left-sided chest wall pain with deep breathing or sometimes when she lays down and this appears to occur about 3 times per year and usually last for several days per episode.  She is apparently had a CT scan of her chest which identified a mass but apparently the mass is not increasing in size.  She complains about being itchy all over with a waxing and waning pattern.  This is a global pruritus and antihistamines really do not help this issue.  She complains of developing facial flushing with redness on her face and heat on her face especially she gets exposed to heat or gets upset.  She is apparently having tachycardia.  Apparently her blood pressure ranges from 40-1 70 with those higher readings occur 1 she is performing minimal exertion.  She has seen cardiology for this issue and has had a heart monitor performed.  She states that she gets ulcers in her mouth and  her nose on occasion.  And on occasion there is an issue with her throat hurting.  Apparently every time she has a strep test that is negative.  She has diffuse joint and muscle aches and sometimes feels as though she has the flu multiple times per year.  She develops night sweats.  She has daily migraines that start in the front of her head and migrate to the back of her head for which she has seen a neurologist and has had evaluation including an LP.  These are throbbing and pressure-like headaches.  She will use he take 1000 mg of ibuprofen  at the onset of the headache.  It sounds as though she is using daily ibuprofen .  Sometimes she will end up in a dark room with a  cold rag on her head and need to lay down.  These type of headaches appear to occur about 4 times per week.  She was given Ajovy  which may have helped the intensity of these headaches.  She states that her hair is falling out.    She states that her calf muscles feel tight like heart is rocks and sometimes she will develop tingling in both her feet especially if she sits for greater than 30 minutes.  She has these tingling episodes about 3 times per week.  She thinks that she sleeps pretty well but she is extremely tired during the week and is sleepy and could easily take a nap.  She drinks 1 coffee in the  morning and 1 Coke 0 throughout the day.  Past Medical History:  Diagnosis Date  . Abnormal CT of the chest 07/04/2021  . Anxiety   . Asthma   . Calculus of gallbladder with chronic cholecystitis without obstruction 11/02/2020  . Chest pain 07/04/2021  . Depression   . Dyspnea 07/04/2021  . Ectopic pregnancy   . Emphysema lung (HCC)    Sees a pulmologist currently being tested for emphysema  . Gastroesophageal reflux disease without esophagitis 05/22/2022  . Hypertension   . Manic-depressive (HCC)   . Mental disorder   . Rectal bleeding 05/22/2022    Past Surgical History:  Procedure Laterality Date  .  ADENOIDECTOMY AND MYRINGOTOMY WITH TUBE PLACEMENT    . CHOLECYSTECTOMY  12/11/2021   RH  . DILATION AND CURETTAGE OF UTERUS    . LAPAROSCOPY FOR ECTOPIC PREGNANCY    . RADICAL HYSTERECTOMY  06/2021   RH Dr. Charissa from [facility], Endometriosis Complications  . TUBAL LIGATION  04/2020    Allergies as of 04/14/2024       Reactions   Tylenol  [acetaminophen ] Other (See Comments)   Jittery, lethargic        Medication List        Accurate as of April 14, 2024  9:32 AM. If you have any questions, ask your nurse or doctor.          STOP taking these medications    COLLAGEN PO Stopped by: Allena Pietila J Kyndal Gloster       TAKE these medications    acebutolol  200 MG capsule Commonly known as: SECTRAL  TAKE 1 CAPSULE BY MOUTH EVERY DAY   Wegovy 0.25 MG/0.5ML Soaj Generic drug: Semaglutide-Weight Management Inject 0.25 mg into the skin.        Review of systems negative except as noted in HPI / PMHx or noted below:  ROS  Family History  Problem Relation Age of Onset  . Hypertension Father   . Heart disease Father   . Healthy Brother   . Healthy Brother   . Bone cancer Maternal Uncle   . Diabetes Paternal Grandfather   . Atrial fibrillation Cousin 29    Social History   Socioeconomic History  . Marital status: Legally Separated    Spouse name: Not on file  . Number of children: 4  . Years of education: 12 + 2  . Highest education level: Not on file  Occupational History  . Occupation: DENTAL ASSISTANT  Tobacco Use  . Smoking status: Former    Current packs/day: 0.00    Types: Cigarettes    Quit date: 12/28/2018    Years since quitting: 5.2    Passive exposure: Never  . Smokeless tobacco: Never  Vaping Use  . Vaping status: Every Day  . Last attempt to quit: 02/03/2023  Substance and Sexual Activity  . Alcohol use: No    Comment: social before pregnancy  . Drug use: No  . Sexual activity: Not Currently  Other Topics Concern  . Not on file  Social History  Narrative  . Not on file   Social Drivers of Health   Financial Resource Strain: Not on File (05/29/2022)   Received from [employer]   . Financial Resource Strain: 0  Food Insecurity: Not on File (07/17/2023)   Received from [employer]   . Food: 0  Transportation Needs: No Transportation Needs (04/11/2023)   PRAPARE - Transportation   . Lack of Transportation (Medical): No   .  Lack of Transportation (Non-Medical): No  Physical Activity: Not on File (05/29/2022)   Received from [facility]   Physical Activity   . Physical Activity: 0  Stress: Not on File (05/29/2022)   Received from [facility]   Stress   . Stress: 0  Social Connections: Not on File (07/01/2023)   Received from [employer]   . Connectedness: 0  Intimate Partner Violence: Not At Risk (04/11/2023)   Humiliation, Afraid, Rape, and Kick questionnaire   . Fear of Current or Ex-Partner: No   . Emotionally Abused: No   . Physically Abused: No   . Sexually Abused: No    Environmental and Social history  Lives in a house with a dry environment, no animals located inside the household, no carpet in the bedroom, no plastic on the bed, no plastic on the pillow, and occasional exposure to tobacco smoke.  She is a Sales executive.  Objective:   Vitals:   04/14/24 0840  BP: 124/84  Pulse: 74   Height: 5' 7 (170.2 cm) Weight: 284 lb (128.8 kg)  Physical Exam  Diagnostics: Allergy skin tests were performed.   Spirometry was performed and demonstrated an FEV1 of *** @ *** % of predicted. FEV1/FVC = ***  The patient had an Asthma Control Test with the following results:  .     Assessment and Plan:    No diagnosis found.  Patient Instructions   1. Blood - tryptase, C4, SED, CRP, thyroid peroxidase ab  2. Urine - Methylhistamine, LTE4, 5HIAA, PGD2  3. Treat and prevent headaches:   A. Slowly decrease caffeine consumption and analgesic use  B. Obtain home sleep study  for sleep apnea  4. Further evaluation and treatment???     Camellia DOROTHA Denis, MD Allergy / Immunology [facility] [place] 

## 2024-04-15 ENCOUNTER — Encounter: Payer: Self-pay | Admitting: Allergy and Immunology

## 2024-04-17 LAB — C4 COMPLEMENT: Complement C4, Serum: 25 mg/dL (ref 12–38)

## 2024-04-17 LAB — C-REACTIVE PROTEIN: CRP: 3 mg/L (ref 0–10)

## 2024-04-17 LAB — THYROID PEROXIDASE ANTIBODY: Thyroperoxidase Ab SerPl-aCnc: 19 [IU]/mL (ref 0–34)

## 2024-04-17 LAB — TRYPTASE: Tryptase: 4.7 ug/L (ref 2.2–13.2)

## 2024-04-17 LAB — SEDIMENTATION RATE: Sed Rate: 10 mm/h (ref 0–32)

## 2024-04-19 ENCOUNTER — Ambulatory Visit: Payer: Self-pay | Admitting: Allergy and Immunology

## 2024-04-27 LAB — PROSTAGLANDIN D2/CREATININE, U
Creatinine, Urine: 160 mg/dL
Prostaglandin D2, urine: 60 pg/mL
Prostaglandin D2/Cr Ratio: 38 ng/g

## 2024-08-17 ENCOUNTER — Telehealth: Payer: Self-pay | Admitting: Cardiology

## 2024-08-17 NOTE — Telephone Encounter (Signed)
 Pts pcp office recommending she have an echocardiogram. Pt requesting order for echo. Please advise.

## 2024-08-19 ENCOUNTER — Other Ambulatory Visit: Payer: Self-pay

## 2024-08-19 DIAGNOSIS — R0600 Dyspnea, unspecified: Secondary | ICD-10-CM

## 2024-08-19 NOTE — Telephone Encounter (Signed)
 Attempted to call the patient to inform her that Dr. Monetta agreed with her PCP to have an echo, and an echo was ordered via Epic. Patient did not answer the phone and her answering machine kept asking for a mailbox number. Will attempt to reach out to the patient at a later time.

## 2024-08-23 NOTE — Telephone Encounter (Signed)
 Called the patient and she stated that she already had an echo scheduled. Patient had no further questions at this time.

## 2024-08-29 NOTE — Progress Notes (Unsigned)
 Cardiology Office Note    Date:  08/29/2024  ID:  Alexandra, Moreno 01/21/1991, MRN [id number] PCP:  Silvano Angeline FALCON, NP  Cardiologist:  None  Electrophysiologist:  None   Chief Complaint: ***  History of Present Illness: .    Alexandra Moreno is a 33 y.o. female with visit-pertinent history of ***  Patient first seen by Dr. Monetta on 11/06/2022 for evaluation of rapid heartbeat.  Patient had recently worn a 7-day event monitor that showed sinus rhythm with no arrhythmias noted.  There was a vague notation that she had cardiomegaly as a child.  It was noted that she had history of palpitations being back to childhood that had worsened recently.  Reported the episodes were abrupt and occurred several times a week and associated with a heart rate greater than 100, noted that she would feel very sick quickly and short of breath with pressure in the chest as well as feeling dizzy and flushed.  She reported that in the past she had lost consciousness with this.  Per patient she did not have an episode while wearing her heart monitor.  An echocardiogram and 30-day monitor were recommended.  Echocardiogram on 11/13/2022 indicated LVEF 60 to 65%, no RWMA, diastolic parameters were normal, RV systolic function and size was normal, normal PASP, trivial mitral valve regurgitation with no evidence of stenosis, aortic valve regurgitation was not visualized, no stenosis was present.  Cardiac monitor showed no episodes of atrial fibrillation, flutter or SVT, there were 11 stable events also she was sinus rhythm or sinus tachycardia.  Today she presents regarding  Palpitations:    Labwork independently reviewed:   ROS: .   *** denies chest pain, shortness of breath, lower extremity edema, fatigue, palpitations, melena, hematuria, hemoptysis, diaphoresis, weakness, presyncope, syncope, orthopnea, and PND.  All other systems are reviewed and otherwise negative.  Studies Reviewed: SABRA    EKG:  EKG is  ordered today, personally reviewed, demonstrating ***     CV Studies: Cardiac studies reviewed are outlined and summarized above. Otherwise please see EMR for full report. Cardiac Studies & Procedures   ______________________________________________________________________________________________     ECHOCARDIOGRAM  ECHOCARDIOGRAM COMPLETE 11/13/2022  Narrative ECHOCARDIOGRAM REPORT    Patient Name:   Alexandra Moreno Date of Exam: 11/13/2022 Medical Rec #:  [id number]     Height:       65.0 in Accession #:    [id number]    Weight:       244.0 lb Date of Birth:  [birth year]     BSA:          2.153 m Patient Age:    31 years      BP:           112/70 mmHg Patient Gender: F             HR:           70 bpm. Exam Location:  [place]  Procedure: 2D Echo, Cardiac Doppler, Color Doppler and Strain Analysis  Indications:    Cardiomegaly I51.7  History:        Patient has no prior history of Echocardiogram examinations. Signs/Symptoms:Chest Pain and Dyspnea; Risk Factors:family history includes Atrial fibrillation.  Sonographer:    Lynwood Silvas RDCS Referring Phys: [id number] BRIAN J MUNLEY  IMPRESSIONS   1. GLS -19.8. Left ventricular ejection fraction, by estimation, is 60 to 65%. The left ventricle has normal function. The left ventricle has no regional wall motion abnormalities. Left  ventricular diastolic parameters were normal. 2. Right ventricular systolic function is normal. The right ventricular size is normal. There is normal pulmonary artery systolic pressure. 3. The mitral valve is normal in structure. Trivial mitral valve regurgitation. No evidence of mitral stenosis. 4. The aortic valve is normal in structure. Aortic valve regurgitation is not visualized. No aortic stenosis is present. 5. The inferior vena cava is normal in size with greater than 50% respiratory variability, suggesting right atrial pressure of 3 mmHg.  FINDINGS Left Ventricle: GLS -19.8. Left ventricular  ejection fraction, by estimation, is 60 to 65%. The left ventricle has normal function. The left ventricle has no regional wall motion abnormalities. The left ventricular internal cavity size was normal in size. There is no left ventricular hypertrophy. Left ventricular diastolic parameters were normal.  Right Ventricle: The right ventricular size is normal. No increase in right ventricular wall thickness. Right ventricular systolic function is normal. There is normal pulmonary artery systolic pressure. The tricuspid regurgitant velocity is 2.04 m/s, and with an assumed right atrial pressure of 3 mmHg, the estimated right ventricular systolic pressure is 19.6 mmHg.  Left Atrium: Left atrial size was normal in size.  Right Atrium: Right atrial size was normal in size.  Pericardium: There is no evidence of pericardial effusion.  Mitral Valve: The mitral valve is normal in structure. Trivial mitral valve regurgitation. No evidence of mitral valve stenosis.  Tricuspid Valve: The tricuspid valve is normal in structure. Tricuspid valve regurgitation is trivial. No evidence of tricuspid stenosis.  Aortic Valve: The aortic valve is normal in structure. Aortic valve regurgitation is not visualized. No aortic stenosis is present.  Pulmonic Valve: The pulmonic valve was normal in structure. Pulmonic valve regurgitation is not visualized. No evidence of pulmonic stenosis.  Aorta: The aortic root is normal in size and structure.  Venous: The inferior vena cava is normal in size with greater than 50% respiratory variability, suggesting right atrial pressure of 3 mmHg.  IAS/Shunts: No atrial level shunt detected by color flow Doppler.   LEFT VENTRICLE PLAX 2D LVIDd:         5.20 cm   Diastology LVIDs:         2.90 cm   LV e' medial:    10.60 cm/s LV PW:         0.90 cm   LV E/e' medial:  10.3 LV IVS:        0.80 cm   LV e' lateral:   13.90 cm/s LVOT diam:     2.00 cm   LV E/e' lateral: 7.8 LV SV:          75 LV SV Index:   35 LVOT Area:     3.14 cm   RIGHT VENTRICLE             IVC RV S prime:     14.00 cm/s  IVC diam: 1.70 cm TAPSE (M-mode): 2.8 cm  LEFT ATRIUM             Index        RIGHT ATRIUM           Index LA diam:        3.30 cm 1.53 cm/m   RA Area:     12.50 cm LA Vol (A2C):   35.7 ml 16.58 ml/m  RA Volume:   26.30 ml  12.22 ml/m LA Vol (A4C):   38.3 ml 17.79 ml/m LA Biplane Vol: 38.3 ml 17.79 ml/m AORTIC VALVE LVOT  Vmax:   113.00 cm/s LVOT Vmean:  75.900 cm/s LVOT VTI:    0.239 m  AORTA Ao Root diam: 2.60 cm Ao Asc diam:  3.10 cm Ao Desc diam: 2.00 cm  MITRAL VALVE                TRICUSPID VALVE MV Area (PHT): 3.95 cm     TR Peak grad:   16.6 mmHg MV Decel Time: 192 msec     TR Vmax:        204.00 cm/s MV E velocity: 109.00 cm/s MV A velocity: 70.00 cm/s   SHUNTS MV E/A ratio:  1.56         Systemic VTI:  0.24 m Systemic Diam: 2.00 cm  Lamar Fitch MD Electronically signed by Lamar Fitch MD Signature Date/Time: 11/13/2022/4:09:50 PM    Final    MONITORS  CARDIAC EVENT MONITOR 12/30/2022  Narrative She was event monitor from 11/13/2022 to 12/17/2022.  There were no episodes of atrial fibrillation flutter or SVT.  There were 11 stable events all associated with sinus rhythm or sinus tachycardia.  There is no bradycardia present pauses of 3 seconds or greater or second or third-degree AV nodal block.       ______________________________________________________________________________________________       Current Reported Medications:.    No outpatient medications have been marked as taking for the 08/30/24 encounter (Appointment) with Cherolyn Behrle D, NP.   Current Facility-Administered Medications for the 08/30/24 encounter (Appointment) with Czar Ysaguirre D, NP  Medication   Fremanezumab -vfrm SOSY 225 mg    Physical Exam:    VS:  LMP 03/08/2014    Wt Readings from Last 3 Encounters:  04/14/24 284 lb (128.8 kg)   04/11/23 244 lb 6.4 oz (110.9 kg)  02/28/23 250 lb (113.4 kg)    GEN: Well nourished, well developed in no acute distress NECK: No JVD; No carotid bruits CARDIAC: ***RRR, no murmurs, rubs, gallops RESPIRATORY:  Clear to auscultation without rales, wheezing or rhonchi  ABDOMEN: Soft, non-tender, non-distended EXTREMITIES:  No edema; No acute deformity     Asessement and Plan:.     ***     Disposition: F/u with ***  Signed, Lamonica Trueba D Deatra Mcmahen, NP

## 2024-08-30 ENCOUNTER — Encounter: Payer: Self-pay | Admitting: Cardiology

## 2024-08-30 ENCOUNTER — Ambulatory Visit: Attending: Cardiology | Admitting: Cardiology

## 2024-08-30 VITALS — BP 114/76 | Ht 65.0 in | Wt 276.4 lb

## 2024-08-30 DIAGNOSIS — R0683 Snoring: Secondary | ICD-10-CM | POA: Insufficient documentation

## 2024-08-30 DIAGNOSIS — R002 Palpitations: Secondary | ICD-10-CM | POA: Insufficient documentation

## 2024-08-30 DIAGNOSIS — R400 Somnolence: Secondary | ICD-10-CM | POA: Insufficient documentation

## 2024-08-30 DIAGNOSIS — I517 Cardiomegaly: Secondary | ICD-10-CM | POA: Diagnosis not present

## 2024-08-30 DIAGNOSIS — R5383 Other fatigue: Secondary | ICD-10-CM | POA: Diagnosis present

## 2024-08-30 DIAGNOSIS — R55 Syncope and collapse: Secondary | ICD-10-CM | POA: Diagnosis present

## 2024-08-30 MED ORDER — PROPRANOLOL HCL 20 MG PO TABS: 20.0000 mg | ORAL_TABLET | Freq: Two times a day (BID) | ORAL | 5 refills | Status: DC

## 2024-08-30 MED ORDER — PROPRANOLOL HCL 10 MG PO TABS: 10.0000 mg | ORAL_TABLET | Freq: Two times a day (BID) | ORAL | 3 refills | Status: AC

## 2024-08-30 NOTE — Patient Instructions (Signed)
 Medication Instructions:  Start Propranolol 10 mg twice daily  *If you need a refill on your cardiac medications before your next appointment, please call your pharmacy*  Lab Work: NONE ordered at this time of appointment   Testing/Procedures: Your physician has recommended that you have a sleep study. This test records several body functions during sleep, including: brain activity, eye movement, oxygen and carbon dioxide blood levels, heart rate and rhythm, breathing rate and rhythm, the flow of air through your mouth and nose, snoring, body muscle movements, and chest and belly movement.   Follow-Up: At [facility], you and your health needs are our priority.  As part of our continuing mission to provide you with exceptional heart care, our providers are all part of one team.  This team includes your primary Cardiologist (physician) and Advanced Practice Providers or APPs (Physician Assistants and Nurse Practitioners) who all work together to provide you with the care you need, when you need it.  Your next appointment:   8 week(s)  Provider:   Katlyn West, NP          We recommend signing up for the patient portal called MyChart.  Sign up information is provided on this After Visit Summary.  MyChart is used to connect with patients for Virtual Visits (Telemedicine).  Patients are able to view lab/test results, encounter notes, upcoming appointments, etc.  Non-urgent messages can be sent to your provider as well.   To learn more about what you can do with MyChart, go to [email or web address].   Other Instructions Increase Hydration/ fluid intake.

## 2024-10-28 NOTE — Progress Notes (Signed)
 "  Cardiology Office Note    Date:  10/29/2024  ID:  Alexandra Moreno, Alexandra Moreno 09-Jul-1991, MRN [id number] PCP:  Silvano Angeline FALCON, NP  Cardiologist:  None  Electrophysiologist:  None   Chief Complaint: Follow up for palpitations   History of Present Illness: .    Alexandra Moreno is a 34 y.o. female with visit-pertinent history of palpitations.   Patient first seen by Dr. Monetta on 11/06/2022 for evaluation of rapid heartbeat.  Patient had recently worn a 7-day event monitor that showed sinus rhythm with no arrhythmias noted.  There was a vague notation that she had cardiomegaly as a child.  It was noted that she had history of palpitations being back to childhood that had worsened recently.  Reported the episodes were abrupt and occurred several times a week and associated with a heart rate greater than 100, noted that she would feel very sick quickly and short of breath with pressure in the chest as well as feeling dizzy and flushed.  She reported that in the past she had lost consciousness with this.  Per patient she did not have an episode while wearing her heart monitor.  An echocardiogram and 30-day monitor were recommended.   Echocardiogram on 11/13/2022 indicated LVEF 60 to 65%, no RWMA, diastolic parameters were normal, RV systolic function and size was normal, normal PASP, trivial mitral valve regurgitation with no evidence of stenosis, aortic valve regurgitation was not visualized, no stenosis was present.   Cardiac monitor showed no episodes of atrial fibrillation, flutter or SVT, there were 11 stable events also she was sinus rhythm or sinus tachycardia.  Patient was seen in clinic on 08/30/2024, she reported episodes of increased palpitations feeling as though her heart was racing with associated dizziness and lightheadedness increased shortness of breath.  She reported that from her young age she had problems with increased palpitations.  She reported that with minimal activity her heart rate would  significantly increase into the 160s and 170s.  She reported that when she tried to exercise her heart rate would significantly increase and she had worsening shortness of breath resulting in sharp chest pain.  Patient previously wore cardiac monitors with evidence of sinus tachycardia, previously was on acebutolol  with improvements in heart rates however she felt that her heart rate was dropping too low at night and discontinued the medication.  Patient reported that her PCP was concerned that she may have POTS given increased dizziness and lightheadedness with position changes.  Patient did report prior syncopal episodes, denied any syncope in the prior 6 months.  Patient reported that she did vape, reported she previously discontinued but did not feel she had a significant change in her palpitations.  Patient noted problems with sleeping at night noting increase snoring and difficulty sleeping restfully.  Patient had worn a repeat cardiac monitor from her primary care with primary rhythm being sinus rhythm with an average heart rate of 86 bpm, minimum heart rate 52 bpm with max heart rate of 150 bpm.  PVC burden less than 1% with 14 PVCs in total.  Patient was started on propranolol  10 mg twice daily.  Echocardiogram on 08/25/2024 showed LVEF 66 5%, normal LV dimensions and systolic function, normal LV diastolic function, mild mitral valve regurgitation.  Today she presents for follow-up.  She reports that she has been doing well overall, has been tolerating propranolol  and has noted improvement in her palpitations as well as her dizziness and lightheadedness.  Patient notes last week  she forgot a few doses of her propranolol  resulting in increased heart rates and a sharp chest pain, resolved with restarting of her propranolol .  She denies any further presyncope or syncope.  She reports that she is now able to climb the stairs in her house without her heart rate increasing above 110 bpm as long as she takes  her propranolol .  She notes that overall she has been significantly improved with propranolol  dosing. ROS: .   Today she denies lower extremity edema, fatigue, palpitations, melena, hematuria, hemoptysis, diaphoresis, weakness, presyncope, syncope, orthopnea, and PND.  All other systems are reviewed and otherwise negative. Studies Reviewed: SABRA   EKG:  EKG is ordered today, personally reviewed, demonstrating  EKG Interpretation Date/Time:  Friday October 29 2024 11:38:18 EST Ventricular Rate:  69 PR Interval:  156 QRS Duration:  80 QT Interval:  396 QTC Calculation: 424 R Axis:   57  Text Interpretation: Normal sinus rhythm Normal ECG When compared with ECG of 30-Aug-2024 11:15, No significant change was found Confirmed by La Dibella [phone] on 10/29/2024 11:40:59 AM   CV Studies: Cardiac studies reviewed are outlined and summarized above. Otherwise please see EMR for full report. Cardiac Studies & Procedures   ______________________________________________________________________________________________     ECHOCARDIOGRAM  ECHOCARDIOGRAM COMPLETE 11/13/2022  Narrative ECHOCARDIOGRAM REPORT    Patient Name:   Alexandra Moreno Date of Exam: 11/13/2022 Medical Rec #:  [id number]     Height:       65.0 in Accession #:    [id number]    Weight:       244.0 lb Date of Birth:  08-16-1991     BSA:          2.153 m Patient Age:    31 years      BP:           112/70 mmHg Patient Gender: F             HR:           70 bpm. Exam Location:  [place]  Procedure: 2D Echo, Cardiac Doppler, Color Doppler and Strain Analysis  Indications:    Cardiomegaly I51.7  History:        Patient has no prior history of Echocardiogram examinations. Signs/Symptoms:Chest Pain and Dyspnea; Risk Factors:family history includes Atrial fibrillation.  Sonographer:    Lynwood Silvas RDCS Referring Phys: [id number] BRIAN J MUNLEY  IMPRESSIONS   1. GLS -19.8. Left ventricular ejection fraction, by estimation, is 60 to  65%. The left ventricle has normal function. The left ventricle has no regional wall motion abnormalities. Left ventricular diastolic parameters were normal. 2. Right ventricular systolic function is normal. The right ventricular size is normal. There is normal pulmonary artery systolic pressure. 3. The mitral valve is normal in structure. Trivial mitral valve regurgitation. No evidence of mitral stenosis. 4. The aortic valve is normal in structure. Aortic valve regurgitation is not visualized. No aortic stenosis is present. 5. The inferior vena cava is normal in size with greater than 50% respiratory variability, suggesting right atrial pressure of 3 mmHg.  FINDINGS Left Ventricle: GLS -19.8. Left ventricular ejection fraction, by estimation, is 60 to 65%. The left ventricle has normal function. The left ventricle has no regional wall motion abnormalities. The left ventricular internal cavity size was normal in size. There is no left ventricular hypertrophy. Left ventricular diastolic parameters were normal.  Right Ventricle: The right ventricular size is normal. No increase in right ventricular wall thickness. Right ventricular systolic  function is normal. There is normal pulmonary artery systolic pressure. The tricuspid regurgitant velocity is 2.04 m/s, and with an assumed right atrial pressure of 3 mmHg, the estimated right ventricular systolic pressure is 19.6 mmHg.  Left Atrium: Left atrial size was normal in size.  Right Atrium: Right atrial size was normal in size.  Pericardium: There is no evidence of pericardial effusion.  Mitral Valve: The mitral valve is normal in structure. Trivial mitral valve regurgitation. No evidence of mitral valve stenosis.  Tricuspid Valve: The tricuspid valve is normal in structure. Tricuspid valve regurgitation is trivial. No evidence of tricuspid stenosis.  Aortic Valve: The aortic valve is normal in structure. Aortic valve regurgitation is not  visualized. No aortic stenosis is present.  Pulmonic Valve: The pulmonic valve was normal in structure. Pulmonic valve regurgitation is not visualized. No evidence of pulmonic stenosis.  Aorta: The aortic root is normal in size and structure.  Venous: The inferior vena cava is normal in size with greater than 50% respiratory variability, suggesting right atrial pressure of 3 mmHg.  IAS/Shunts: No atrial level shunt detected by color flow Doppler.   LEFT VENTRICLE PLAX 2D LVIDd:         5.20 cm   Diastology LVIDs:         2.90 cm   LV e' medial:    10.60 cm/s LV PW:         0.90 cm   LV E/e' medial:  10.3 LV IVS:        0.80 cm   LV e' lateral:   13.90 cm/s LVOT diam:     2.00 cm   LV E/e' lateral: 7.8 LV SV:         75 LV SV Index:   35 LVOT Area:     3.14 cm   RIGHT VENTRICLE             IVC RV S prime:     14.00 cm/s  IVC diam: 1.70 cm TAPSE (M-mode): 2.8 cm  LEFT ATRIUM             Index        RIGHT ATRIUM           Index LA diam:        3.30 cm 1.53 cm/m   RA Area:     12.50 cm LA Vol (A2C):   35.7 ml 16.58 ml/m  RA Volume:   26.30 ml  12.22 ml/m LA Vol (A4C):   38.3 ml 17.79 ml/m LA Biplane Vol: 38.3 ml 17.79 ml/m AORTIC VALVE LVOT Vmax:   113.00 cm/s LVOT Vmean:  75.900 cm/s LVOT VTI:    0.239 m  AORTA Ao Root diam: 2.60 cm Ao Asc diam:  3.10 cm Ao Desc diam: 2.00 cm  MITRAL VALVE                TRICUSPID VALVE MV Area (PHT): 3.95 cm     TR Peak grad:   16.6 mmHg MV Decel Time: 192 msec     TR Vmax:        204.00 cm/s MV E velocity: 109.00 cm/s MV A velocity: 70.00 cm/s   SHUNTS MV E/A ratio:  1.56         Systemic VTI:  0.24 m Systemic Diam: 2.00 cm  Lamar Fitch MD Electronically signed by Lamar Fitch MD Signature Date/Time: 11/13/2022/4:09:50 PM    Final    MONITORS  CARDIAC EVENT MONITOR 12/30/2022  Narrative She  was event monitor from 11/13/2022 to 12/17/2022.  There were no episodes of atrial fibrillation flutter or SVT.   There were 11 stable events all associated with sinus rhythm or sinus tachycardia.  There is no bradycardia present pauses of 3 seconds or greater or second or third-degree AV nodal block.       ______________________________________________________________________________________________       Current Reported Medications:.    Active Medications[1]  Physical Exam:    VS:  BP 118/86   Pulse 69   Ht 5' 5 (1.651 m)   Wt 279 lb 12.8 oz (126.9 kg)   LMP 03/08/2014   SpO2 99%   BMI 46.56 kg/m    Wt Readings from Last 3 Encounters:  10/29/24 279 lb 12.8 oz (126.9 kg)  08/30/24 276 lb 6.4 oz (125.4 kg)  04/14/24 284 lb (128.8 kg)    GEN: Well nourished, well developed in no acute distress NECK: No JVD; No carotid bruits CARDIAC: RRR, no murmurs, rubs, gallops RESPIRATORY:  Clear to auscultation without rales, wheezing or rhonchi  ABDOMEN: Soft, non-tender, non-distended EXTREMITIES:  No edema; No acute deformity     Asessement and Plan:.    Palpitations/inappropriate sinus tachycardia: Patient reports history of palpitations and was told in childhood that she had an enlarged heart.  Cardiac monitor in 12/2022 showed no episodes of atrial fibrillation, flutter or SVT, there were 11 stable events also she was sinus rhythm or sinus tachycardia. Reports in the last 4 to 5 years that her palpitations and feeling as though his heart racing has significantly worsened. Patient with recent repeat cardiac monitor with primary rhythm being sinus rhythm with an average heart rate of 86 bpm, minimum heart rate was 52 bpm with max heart rate 150 bpm. PVC burden less than 1% with 14 PVCs total.  Patient was not orthostatic at last office visit and was without significant increases heart rate to indicate POTS.  Started on propranolol  10 mg twice daily. Today she reports that she has noted significant improvements on propranolol , she reports that her heart rate has been well-controlled and she is no  longer experiencing significant dizziness and lightheadedness.  Encouraged patient to continue staying well-hydrated and propranolol  10 mg twice daily.  Syncope: Patient with history of syncopal episodes, last episode over 9 months ago.  Recent cardiac monitor was unrevealing.  Echocardiogram on 08/25/2024 also reassuring. Reviewed ED precautions. Today she denies any further presyncope or syncope.  Sleep disordered breathing/fatigue: Split-night sleep study currently pending.   Disposition: F/u with Dr. Monetta in six months or sooner if needed.   Signed, Brittane Grudzinski D Katya Rolston, NP       [1]  Current Meds  Medication Sig   Biotin 1 MG CAPS Take 1 mg by mouth daily.   propranolol  (INDERAL ) 10 MG tablet Take 1 tablet (10 mg total) by mouth 2 (two) times daily.   Current Facility-Administered Medications for the 10/29/24 encounter (Office Visit) with Surie Suchocki D, NP  Medication   Fremanezumab -vfrm SOSY 225 mg   "

## 2024-10-29 ENCOUNTER — Ambulatory Visit: Attending: Cardiology | Admitting: Cardiology

## 2024-10-29 ENCOUNTER — Encounter: Payer: Self-pay | Admitting: Cardiology

## 2024-10-29 VITALS — BP 118/86 | HR 69 | Ht 65.0 in | Wt 279.8 lb

## 2024-10-29 DIAGNOSIS — R002 Palpitations: Secondary | ICD-10-CM | POA: Insufficient documentation

## 2024-10-29 DIAGNOSIS — R400 Somnolence: Secondary | ICD-10-CM | POA: Diagnosis present

## 2024-10-29 DIAGNOSIS — R0683 Snoring: Secondary | ICD-10-CM | POA: Insufficient documentation

## 2024-10-29 DIAGNOSIS — R55 Syncope and collapse: Secondary | ICD-10-CM | POA: Diagnosis present

## 2024-10-29 NOTE — Patient Instructions (Signed)
 Medication Instructions:  Your physician recommends that you continue on your current medications as directed. Please refer to the Current Medication list given to you today.  *If you need a refill on your cardiac medications before your next appointment, please call your pharmacy*  Lab Work: NONE If you have labs (blood work) drawn today and your tests are completely normal, you will receive your results only by: MyChart Message (if you have MyChart) OR A paper copy in the mail If you have any lab test that is abnormal or we need to change your treatment, we will call you to review the results.  Testing/Procedures: NONE  Follow-Up: At [facility], you and your health needs are our priority.  As part of our continuing mission to provide you with exceptional heart care, our providers are all part of one team.  This team includes your primary Cardiologist (physician) and Advanced Practice Providers or APPs (Physician Assistants and Nurse Practitioners) who all work together to provide you with the care you need, when you need it.  Your next appointment:   6 month(s)  Dr. Monetta
